# Patient Record
Sex: Female | Born: 1935 | Race: White | Hispanic: No | Smoking: Never smoker
Health system: Southern US, Community
[De-identification: ages and names within clinical notes are randomized; demographics above are authoritative.]

## PROBLEM LIST (undated history)

## (undated) DIAGNOSIS — H9190 Unspecified hearing loss, unspecified ear: Secondary | ICD-10-CM

## (undated) DIAGNOSIS — I10 Essential (primary) hypertension: Secondary | ICD-10-CM

## (undated) DIAGNOSIS — H3530 Unspecified macular degeneration: Secondary | ICD-10-CM

## (undated) DIAGNOSIS — N809 Endometriosis, unspecified: Secondary | ICD-10-CM

## (undated) DIAGNOSIS — M4800 Spinal stenosis, site unspecified: Secondary | ICD-10-CM

## (undated) DIAGNOSIS — E7800 Pure hypercholesterolemia, unspecified: Secondary | ICD-10-CM

## (undated) DIAGNOSIS — E039 Hypothyroidism, unspecified: Secondary | ICD-10-CM

## (undated) DIAGNOSIS — K5792 Diverticulitis of intestine, part unspecified, without perforation or abscess without bleeding: Secondary | ICD-10-CM

## (undated) DIAGNOSIS — M1990 Unspecified osteoarthritis, unspecified site: Secondary | ICD-10-CM

## (undated) DIAGNOSIS — K219 Gastro-esophageal reflux disease without esophagitis: Secondary | ICD-10-CM

## (undated) DIAGNOSIS — R7303 Prediabetes: Secondary | ICD-10-CM

## (undated) DIAGNOSIS — E785 Hyperlipidemia, unspecified: Secondary | ICD-10-CM

## (undated) DIAGNOSIS — R011 Cardiac murmur, unspecified: Secondary | ICD-10-CM

## (undated) HISTORY — DX: Cardiac murmur, unspecified: R01.1

## (undated) HISTORY — DX: Hypothyroidism, unspecified: E03.9

## (undated) HISTORY — DX: Endometriosis, unspecified: N80.9

## (undated) HISTORY — PX: ABDOMINAL HYSTERECTOMY: SHX81

## (undated) HISTORY — DX: Spinal stenosis, site unspecified: M48.00

## (undated) HISTORY — DX: Gastro-esophageal reflux disease without esophagitis: K21.9

## (undated) HISTORY — PX: TONSILLECTOMY: SUR1361

## (undated) HISTORY — PX: TUMOR REMOVAL: SHX12

## (undated) HISTORY — DX: Hyperlipidemia, unspecified: E78.5

## (undated) HISTORY — DX: Prediabetes: R73.03

## (undated) HISTORY — DX: Unspecified macular degeneration: H35.30

## (undated) HISTORY — DX: Unspecified osteoarthritis, unspecified site: M19.90

## (undated) HISTORY — PX: KNEE SURGERY: SHX244

## (undated) HISTORY — DX: Pure hypercholesterolemia, unspecified: E78.00

## (undated) HISTORY — DX: Unspecified hearing loss, unspecified ear: H91.90

## (undated) HISTORY — PX: BACK SURGERY: SHX140

## (undated) HISTORY — PX: ANKLE SURGERY: SHX546

## (undated) HISTORY — PX: APPENDECTOMY: SHX54

---

## 1997-11-25 ENCOUNTER — Ambulatory Visit (HOSPITAL_COMMUNITY): Admission: RE | Admit: 1997-11-25 | Discharge: 1997-11-25 | Payer: Self-pay | Admitting: Internal Medicine

## 1998-02-10 ENCOUNTER — Other Ambulatory Visit: Admission: RE | Admit: 1998-02-10 | Discharge: 1998-02-10 | Payer: Self-pay | Admitting: Internal Medicine

## 1998-12-17 ENCOUNTER — Encounter: Payer: Self-pay | Admitting: Internal Medicine

## 1998-12-17 ENCOUNTER — Ambulatory Visit (HOSPITAL_COMMUNITY): Admission: RE | Admit: 1998-12-17 | Discharge: 1998-12-17 | Payer: Self-pay | Admitting: Internal Medicine

## 2000-12-20 ENCOUNTER — Encounter: Payer: Self-pay | Admitting: Internal Medicine

## 2000-12-20 ENCOUNTER — Ambulatory Visit (HOSPITAL_COMMUNITY): Admission: RE | Admit: 2000-12-20 | Discharge: 2000-12-20 | Payer: Self-pay | Admitting: Internal Medicine

## 2001-04-12 ENCOUNTER — Ambulatory Visit (HOSPITAL_COMMUNITY): Admission: RE | Admit: 2001-04-12 | Discharge: 2001-04-12 | Payer: Self-pay | Admitting: Ophthalmology

## 2004-02-22 ENCOUNTER — Ambulatory Visit (HOSPITAL_COMMUNITY): Admission: RE | Admit: 2004-02-22 | Discharge: 2004-02-22 | Payer: Self-pay | Admitting: Internal Medicine

## 2005-06-13 ENCOUNTER — Ambulatory Visit: Payer: Self-pay

## 2007-09-24 ENCOUNTER — Ambulatory Visit (HOSPITAL_COMMUNITY): Admission: RE | Admit: 2007-09-24 | Discharge: 2007-09-24 | Payer: Self-pay | Admitting: Internal Medicine

## 2007-12-02 ENCOUNTER — Ambulatory Visit (HOSPITAL_COMMUNITY): Admission: RE | Admit: 2007-12-02 | Discharge: 2007-12-03 | Payer: Self-pay | Admitting: Orthopaedic Surgery

## 2009-01-26 IMAGING — CR DG LUMBAR SPINE 1V
1 series · 1 of 1 positions shown · non-contrast
Comparison: none

CLINICAL DATA: Lumbar spine stenosis. 
 LUMBAR SPINE - 1 VIEW:

[view not recorded]
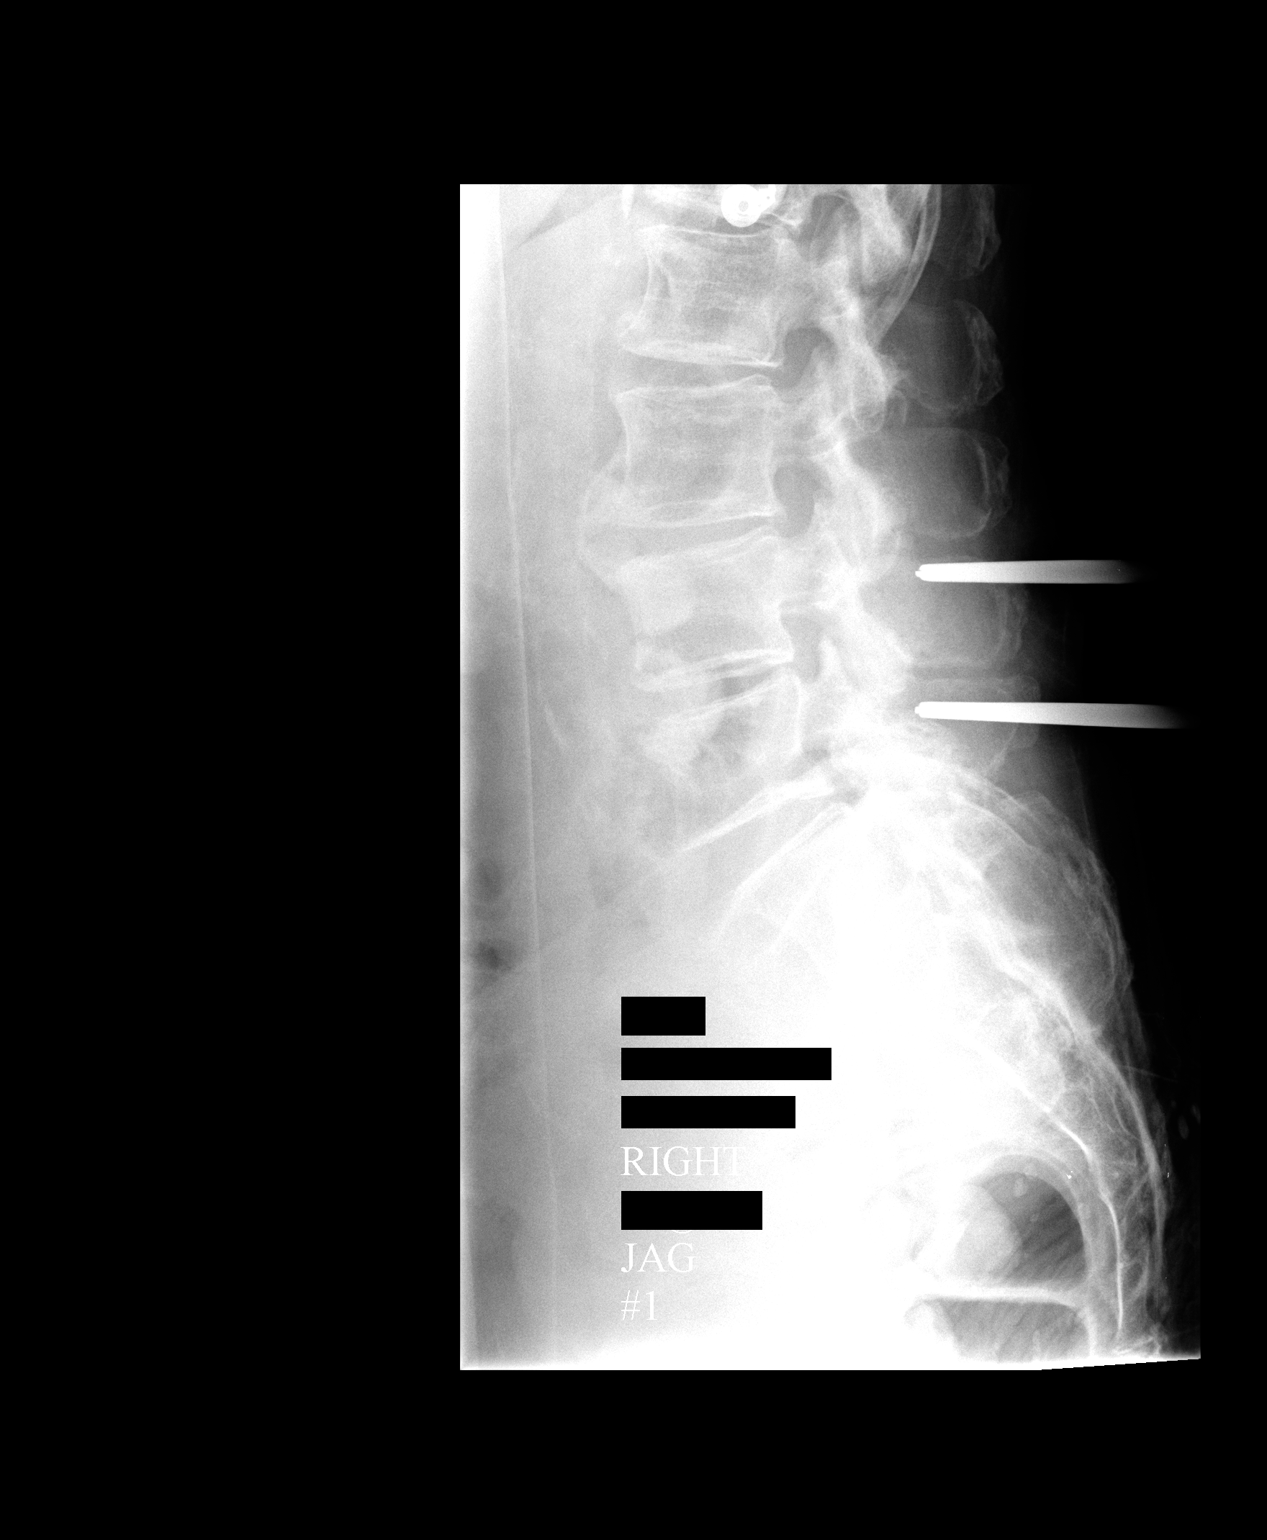

[1 of 1 positions shown; findings below may reference images not displayed]

FINDINGS: A single intraoperative view demonstrates surgical placement markers posterior to the L4 and L5 vertebral bodies.
IMPRESSION: Surgical markers as described above.

## 2009-02-25 ENCOUNTER — Ambulatory Visit (HOSPITAL_COMMUNITY): Admission: RE | Admit: 2009-02-25 | Discharge: 2009-02-25 | Payer: Self-pay | Admitting: Internal Medicine

## 2009-03-23 ENCOUNTER — Emergency Department (HOSPITAL_BASED_OUTPATIENT_CLINIC_OR_DEPARTMENT_OTHER): Admission: EM | Admit: 2009-03-23 | Discharge: 2009-03-23 | Payer: Self-pay | Admitting: Emergency Medicine

## 2009-09-19 ENCOUNTER — Emergency Department (HOSPITAL_BASED_OUTPATIENT_CLINIC_OR_DEPARTMENT_OTHER): Admission: EM | Admit: 2009-09-19 | Discharge: 2009-09-19 | Payer: Self-pay | Admitting: Emergency Medicine

## 2010-03-23 ENCOUNTER — Ambulatory Visit: Payer: Self-pay | Admitting: Vascular Surgery

## 2010-10-02 ENCOUNTER — Encounter: Payer: Self-pay | Admitting: Internal Medicine

## 2010-10-03 ENCOUNTER — Encounter: Payer: Self-pay | Admitting: Internal Medicine

## 2010-11-27 LAB — BASIC METABOLIC PANEL
BUN: 30 mg/dL — ABNORMAL HIGH (ref 6–23)
Chloride: 103 mEq/L (ref 96–112)
GFR calc Af Amer: >60 mL/min (ref >60)
GFR calc non Af Amer: 60 mL/min — ABNORMAL LOW (ref >60)
Potassium: 4.4 mEq/L (ref 3.5–5.1)
Sodium: 141 mEq/L (ref 135–145)

## 2010-11-27 LAB — POCT CARDIAC MARKERS
Myoglobin, poc: 86.3 ng/mL (ref 12–200)
Troponin i, poc: <0.05 ng/mL (ref 0.00–0.09)

## 2010-11-27 LAB — URINALYSIS, ROUTINE W REFLEX MICROSCOPIC
Glucose, UA: NEGATIVE mg/dL
Hgb urine dipstick: NEGATIVE
Ketones, ur: NEGATIVE mg/dL
pH: 7.5 (ref 5.0–8.0)

## 2010-11-27 LAB — CBC
HCT: 40.4 % (ref 36.0–46.0)
Hemoglobin: 13.7 g/dL (ref 12.0–15.0)
MCV: 92.4 fL (ref 78.0–100.0)
Platelets: 237 10*3/uL (ref 150–400)
RBC: 4.38 MIL/uL (ref 3.87–5.11)
WBC: 11.3 10*3/uL — ABNORMAL HIGH (ref 4.0–10.5)

## 2010-11-27 LAB — DIFFERENTIAL
Eosinophils Relative: 0 % (ref 0–5)
Lymphocytes Relative: 21 % (ref 12–46)
Lymphs Abs: 2.3 10*3/uL (ref 0.7–4.0)
Monocytes Relative: 4 % (ref 3–12)

## 2011-01-24 NOTE — Procedures (Signed)
DUPLEX DEEP VENOUS EXAM - LOWER EXTREMITY   INDICATION:  Followup, left leg swelling.   HISTORY:  Edema:  Yes.  Trauma/Surgery:  No.  Pain:  No.  PE:  No.  Previous DVT:  No.  Anticoagulants:  No.  Other:  No.   DUPLEX EXAM:                CFV   SFV   PopV  PTV    GSV                R  L  R  L  R  L  R   L  R  L  Thrombosis    o  o     o     o      o     o  Spontaneous   +  +     +     +      +     +  Phasic        +  +     +     +      +     +  Augmentation  +  +     +     +      +     +  Compressible  +  +     +     +      +     +  Competent     +  o     +     +      +     o   Legend:  + - yes  o - no  p - partial  D - decreased   IMPRESSION:  All veins imaged appear compressible and free from deep  venous thrombus.  Reflux is noted in the left common femoral vein and  greater saphenous vein.   Preliminary report given to Lurena Joiner at office.    _____________________________  Di Kindle. Edilia Bo, M.D.   CB/MEDQ  D:  03/23/2010  T:  03/23/2010  Job:  [id number]

## 2011-01-24 NOTE — Op Note (Signed)
[name], Kristie [name]           ACCOUNT NO.:  [id number]   MEDICAL RECORD NO.:  [id number]           PATIENT TYPE:   LOCATION:                                 FACILITY:   PHYSICIAN:  Mark C. Ophelia Charter, M.D.    DATE OF BIRTH:  1923/07/02   DATE OF PROCEDURE:  11/28/2007  DATE OF DISCHARGE:                               OPERATIVE REPORT   PREOPERATIVE DIAGNOSIS:  L4-5, L5-S1 central stenosis with neurogenic  claudication.   POSTOPERATIVE DIAGNOSIS:  L4-5, L5-S1 central stenosis with neurogenic  claudication.   PROCEDURE:  L4-5, L5-S1 decompression, microscope assisted.   SURGEON:  Mark C. Ophelia Charter, M.D.   ASSISTANT:  Kristie Neighbors, PA-C.   ANESTHESIA:  GOT.   ESTIMATED BLOOD LOSS:  Minimal.   This 77 year old female has had progressive neurogenic claudication with  pain with standing, pain with ambulation short distances, and MRI scan  shows severe spinal stenosis at L4-5, moderately severe at L5-S1.  Other  levels are relatively normal for her age.   PROCEDURE:  After induction of general anesthesia, orotracheal  intubation, the patient was placed prone on chest rolls.  Back was  prepped with DuraPrep.  The area was squared with towels.  Then, a Vi-  Drape applied, then laminectomy sheets and drapes.  Time-out checklist  was performed and confirmed.  Preoperative Ancef was given for ET  prophylaxis, and Solu-Cortef due to her past prednisone history x  several years.  Midline incision was made.  Subperiosteal dissection,  with L5-S1 palpated and L4-5.  Kocher clamp was placed just above the L4-  5 level, expected at the L4 off pedicle, the top of the area where the  decompression needed to be performed, and then one distal just above the  L5-S1 disc space.  Cross-table lateral x-ray confirmed appropriate  position and marking on the bone with the skin marker as well as the  skin and the drapes for the planned decompression.  The L4-5 level was  done first, which was  extremely tight.  After this was performed, the  lamina was thinned down at L5.  Thick chunks of ligament were removed at  the L5-S1 level and foraminotomies performed.  There was significant  facet overhang at L4-5, which was trimmed back to the level of the  pedicle.  There were hypertrophic chunks of ligament that were causing  the central stenosis, and this was removed.  There was mild scar tissue  between the ligament and the dura which was dissected with the operative  microscope, removing the chunks of ligament after passing a hockey stick  underneath the dural separator to freed it up and removing it with the 2  and 3 mm Kerrisons.  Once all ligaments were removed, some remaining  bone spurs were trimmed, and then both gutters were run, both right and  left side.  There was significant disc protrusion at the L4-5 level, and  although  decompression had been done, there were some extruded  fragments, so an incision was made in the annulus, and a ball-tip nerve  hook was used to press against the ligament  at the midline and tease it  toward the opening, and the small chunks of disc that were extruded and  causing the central decompression were sucked up with the sucker and  grasped with the micropituitary.  Passes were not made centrally through  the disc.  Once the fragments were decompressed and extruded wound, the  wound was irrigated, final passing anteriorly, with no areas  compression.  Hemostasis was obtained with bipolar and the  regular cautery.  Deep fascia was closed with 0 Vicryl, 2-0 Vicryl in  the subcutaneous tissue, 4-0 Vicryl subcuticular closure.  Tincture of  Benzoin, Steri-Strips, and Marcaine infiltration.  Postop dressing, and  transferred to the recovery room in stable condition.  Instrument count  and needle count was correct.      Mark C. Ophelia Charter, M.D.  Electronically Signed     MCY/MEDQ  D:  12/02/2007  T:  12/03/2007  Job:  [id number]

## 2011-01-27 NOTE — H&P (Signed)
[place]. [facility]  Patient:    Kristie [name], Kristie [name]                  MRN: [id number] Adm. Date:  [id number] Disc. Date: [id number] Attending:  Ivor Messier CC:         Delon Sacramento, M.D.  Marinus Maw, M.D.   History and Physical  REASON FOR ADMISSION:  This was a planned outpatient surgical admission of this 77 year old white female admitted for vitrectomy surgery of the right eye.  PRESENT ILLNESS:  This patient was referred to my office by Dr. Delon Sacramento, her regular ophthalmologist, for evaluation of retained lens fragments following cataract and posterior capsule surgery.  The patient had cataract surgery performed on April 10, 2000.  Postoperatively, the patient had developed a thick posterior capsule and had multiple YAG laser capsulotomies, the original starting on November 2001, followed by August 14, 2001, September 18, 2000, November 06, 2000.  Examination in my office revealed retained cataract capsule fragments in the vitreous and marked vitreous fibrillar degeneration behind the implant.  This was particularly troublesome to the patient and it was elected to perform posterior vitrectomy surgery to remove these retained fragments and vitreous degeneration.  Patient has limited vision in her left eye due to childhood amblyopia.  The patient was given oral discussion and printed information concerning the procedure and its possible complications.  She signed an informed consent and arrangements were made for her outpatient admission at this time.  PAST MEDICAL HISTORY:  Patient is in stable general health under the care of her regular physician, Dr. Marinus Maw.  Patient is currently taking Paxil for fibromyalgia, Xanax at night for sleep, prednisone for inflammation of muscles every other day and Synthroid under Dr. Michaelle Birks direction.  REVIEW OF SYSTEMS:  No cardiorespiratory complaints.  PHYSICAL  EXAMINATION:  GENERAL:  Patient is a pleasant, well-nourished, well-developed white female in no acute ocular distress.  VITAL SIGNS:  As recorded on admission, blood pressure 148/107, temperature 97.2, heart rate 62, respirations 16.  HEENT:  Eyes:  Visual acuity 20/50-, right eye; 20/400, left eye, without correction.  Near vision 20/40, right eye; ______ , left eye.  Patient currently only uses magnifier glasses for reading.  Applanation tonometry 14 mm, right eye; 12, left eye.  External ocular and slitlamp examination: The eyes are white and clear.  The right eye reveals a clear cornea, deep and clear anterior chamber.  A posterior chamber well-centered implant is present. The posterior capsule is open with multiple laser pits following the multiple YAG laser capsulotomies.  Directly behind the pupil is a dense area of retained capsule fragments and vitreous fibrillar degeneration.  The vitreous posteriorly is clear, the retina attached with optic nerve sharply outlined and of good color.  Disk-cup ratio 0.2.  The macula is slightly glassy in appearance without hemorrhage, exudation of neovascularization.  CHEST:  Lungs clear to percussion and auscultation.  HEART:  Normal sinus rhythm.  No cardiomegaly.  No murmurs.  ABDOMEN:  Negative.  EXTREMITIES:  Negative.  ADMISSION DIAGNOSIS:  Retained cataract fragments following cataract and posterior capsule surgery.  SURGICAL PLAN:  Posterior vitrectomy through pars plana through vitreous infusion suction cutter. DD:  04/12/01 TD:  04/12/01 Job: [id number]

## 2011-01-27 NOTE — Op Note (Signed)
[place]. [facility]  Patient:    Kristie [name], Kristie [name]                  MRN: [id number] Proc. Date: 04/12/01 Adm. Date:  [id number] Disc. Date: [id number] Attending:  Ivor Messier CC:         Delon Sacramento, M.D.  Marinus Maw, M.D.   Operative Report  PREOPERATIVE DIAGNOSIS:  Retained cataract fragments following cataract and posterior capsule surgery.  POSTOPERATIVE DIAGNOSIS:  Retained cataract fragments following cataract and posterior capsule surgery.  OPERATION:  Posterior vitrectomy through pars plana using vitreous infusion suction cutter.  SURGEON:  Guadelupe Sabin, M.D.  ASSISTANT:  Nurse.  ANESTHESIA:  Local 4% Xylocaine, 0.75% Marcaine retrobulbar block, topical tetracaine.  Anesthesia standby required.  Patient given sodium pentothal intravenously during the period of retrobulbar injection.  DESCRIPTION OF PROCEDURE:  A lid speculum was inserted in the right eye.  A superior rectus traction suture was placed.  A peritomy was performed adjacent to the limbus from the 8 to 2:30 position.  The corneoscleral junction was cleaned and corneoscleral groove made with a 45 degree Superblade.  The anterior chamber was then entered at the 10, 2 and 8 oclock positions with the MVR blade.  The 4-mm vitreous infusion terminal was secured in place at the 8 oclock position with a 5-0 white mattress Dacron suture.  The fiberoptic lightpipe was inserted at the 2 oclock position and the handpiece of the vitreous infusion suction cutter at the 10 oclock position.  Slow vitreous infusion suction cutting were begun.  The dense vitreous fibrillar degeneration and retained capsule fragments were then aspirated.  Gradually, the vitreous was cleared as far as possible into the periphery.  The posterior vitreous was aspirated and appeared clear.  The retina appeared attached with normal optic nerve, blood vessel and macula.  It was then  elected to close after indirect ophthalmoscopy with scleral depression was carried out revealing no peripheral retinal tears or detachment areas.  The sclerotomy sites were closed with 7-0 Vicryl interrupted sutures.  The conjunctiva itself was closed with a running 7-0 Vicryl suture.  Depo-dexamethasone and Garamycin were injected in the sub-tenon space inferiorly.  Atropine and Maxitrol ointment were instilled in the conjunctival cul-de-sac.  A light patch and protective shield were applied.  Duration of procedure:  One hour.  Patient tolerated procedure well in general and left the operating room for the recovery room in good condition. DD:  04/12/01 TD:  04/12/01 Job: [id number]

## 2011-02-13 ENCOUNTER — Emergency Department (INDEPENDENT_AMBULATORY_CARE_PROVIDER_SITE_OTHER): Payer: PRIVATE HEALTH INSURANCE

## 2011-02-13 ENCOUNTER — Emergency Department (HOSPITAL_BASED_OUTPATIENT_CLINIC_OR_DEPARTMENT_OTHER)
Admission: EM | Admit: 2011-02-13 | Discharge: 2011-02-13 | Disposition: A | Discharge status: Home / Self Care | Payer: PRIVATE HEALTH INSURANCE | Attending: Emergency Medicine | Admitting: Emergency Medicine

## 2011-02-13 DIAGNOSIS — R5383 Other fatigue: Secondary | ICD-10-CM

## 2011-02-13 DIAGNOSIS — IMO0001 Reserved for inherently not codable concepts without codable children: Secondary | ICD-10-CM | POA: Insufficient documentation

## 2011-02-13 DIAGNOSIS — R5381 Other malaise: Secondary | ICD-10-CM | POA: Insufficient documentation

## 2011-02-13 DIAGNOSIS — E7800 Pure hypercholesterolemia, unspecified: Secondary | ICD-10-CM | POA: Insufficient documentation

## 2011-02-13 DIAGNOSIS — J438 Other emphysema: Secondary | ICD-10-CM | POA: Insufficient documentation

## 2011-02-13 DIAGNOSIS — I10 Essential (primary) hypertension: Secondary | ICD-10-CM | POA: Insufficient documentation

## 2011-02-13 LAB — COMPREHENSIVE METABOLIC PANEL
ALT: 22 U/L (ref 0–35)
AST: 28 U/L (ref 0–37)
Albumin: 4 g/dL (ref 3.5–5.2)
Alkaline Phosphatase: 74 U/L (ref 39–117)
BUN: 15 mg/dL (ref 6–23)
Potassium: 3.7 mEq/L (ref 3.5–5.1)
Sodium: 140 mEq/L (ref 135–145)
Total Protein: 7.4 g/dL (ref 6.0–8.3)

## 2011-02-13 LAB — URINALYSIS, ROUTINE W REFLEX MICROSCOPIC
Bilirubin Urine: NEGATIVE
Ketones, ur: NEGATIVE mg/dL
Nitrite: NEGATIVE
Urobilinogen, UA: 0.2 mg/dL (ref 0.0–1.0)

## 2011-02-13 LAB — CBC
MCHC: 33.1 g/dL (ref 30.0–36.0)
Platelets: 224 10*3/uL (ref 150–400)
RDW: 13.9 % (ref 11.5–15.5)

## 2011-02-13 LAB — DIFFERENTIAL
Basophils Absolute: 0 10*3/uL (ref 0.0–0.1)
Basophils Relative: 0 % (ref 0–1)
Eosinophils Absolute: 0 10*3/uL (ref 0.0–0.7)
Eosinophils Relative: 0 % (ref 0–5)
Monocytes Absolute: 0.7 10*3/uL (ref 0.1–1.0)

## 2011-06-05 LAB — CBC
HCT: 46
MCV: 94.4
Platelets: 248
WBC: 10.1

## 2011-06-05 LAB — COMPREHENSIVE METABOLIC PANEL
AST: 30
CO2: 29
Calcium: 9.8
Creatinine, Ser: 0.87
GFR calc non Af Amer: >60

## 2011-06-05 LAB — URINALYSIS, ROUTINE W REFLEX MICROSCOPIC
Ketones, ur: NEGATIVE
Nitrite: NEGATIVE
Protein, ur: NEGATIVE

## 2011-06-05 LAB — DIFFERENTIAL
Basophils Absolute: 0.1
Lymphocytes Relative: 37
Monocytes Absolute: 0.5
Neutro Abs: 5.8

## 2011-06-05 LAB — PROTIME-INR
INR: 0.9
Prothrombin Time: 12.5

## 2011-06-05 LAB — APTT: aPTT: 32

## 2012-01-11 ENCOUNTER — Emergency Department (INDEPENDENT_AMBULATORY_CARE_PROVIDER_SITE_OTHER): Payer: Medicare Other

## 2012-01-11 ENCOUNTER — Encounter (HOSPITAL_BASED_OUTPATIENT_CLINIC_OR_DEPARTMENT_OTHER): Payer: Self-pay | Admitting: *Deleted

## 2012-01-11 ENCOUNTER — Emergency Department (HOSPITAL_BASED_OUTPATIENT_CLINIC_OR_DEPARTMENT_OTHER)
Admission: EM | Admit: 2012-01-11 | Discharge: 2012-01-11 | Disposition: A | Discharge status: Home / Self Care | Payer: Medicare Other | Attending: Emergency Medicine | Admitting: Emergency Medicine

## 2012-01-11 DIAGNOSIS — K5730 Diverticulosis of large intestine without perforation or abscess without bleeding: Secondary | ICD-10-CM

## 2012-01-11 DIAGNOSIS — R109 Unspecified abdominal pain: Secondary | ICD-10-CM | POA: Insufficient documentation

## 2012-01-11 DIAGNOSIS — K5732 Diverticulitis of large intestine without perforation or abscess without bleeding: Secondary | ICD-10-CM | POA: Insufficient documentation

## 2012-01-11 DIAGNOSIS — R911 Solitary pulmonary nodule: Secondary | ICD-10-CM

## 2012-01-11 DIAGNOSIS — R011 Cardiac murmur, unspecified: Secondary | ICD-10-CM | POA: Insufficient documentation

## 2012-01-11 DIAGNOSIS — E079 Disorder of thyroid, unspecified: Secondary | ICD-10-CM | POA: Insufficient documentation

## 2012-01-11 DIAGNOSIS — R1032 Left lower quadrant pain: Secondary | ICD-10-CM

## 2012-01-11 DIAGNOSIS — K5792 Diverticulitis of intestine, part unspecified, without perforation or abscess without bleeding: Secondary | ICD-10-CM

## 2012-01-11 DIAGNOSIS — R198 Other specified symptoms and signs involving the digestive system and abdomen: Secondary | ICD-10-CM

## 2012-01-11 LAB — CBC
HCT: 43.7 % (ref 36.0–46.0)
Platelets: 191 10*3/uL (ref 150–400)
RBC: 4.74 MIL/uL (ref 3.87–5.11)
RDW: 14.3 % (ref 11.5–15.5)
WBC: 7.1 10*3/uL (ref 4.0–10.5)

## 2012-01-11 LAB — COMPREHENSIVE METABOLIC PANEL
Alkaline Phosphatase: 67 U/L (ref 39–117)
BUN: 16 mg/dL (ref 6–23)
Creatinine, Ser: 0.9 mg/dL (ref 0.50–1.10)
GFR calc Af Amer: 65 mL/min — ABNORMAL LOW (ref >90)
Glucose, Bld: 83 mg/dL (ref 70–99)
Potassium: 3.7 mEq/L (ref 3.5–5.1)
Total Bilirubin: 0.7 mg/dL (ref 0.3–1.2)
Total Protein: 6.9 g/dL (ref 6.0–8.3)

## 2012-01-11 LAB — URINALYSIS, ROUTINE W REFLEX MICROSCOPIC
Leukocytes, UA: NEGATIVE
Nitrite: NEGATIVE
Specific Gravity, Urine: 1.007 (ref 1.005–1.030)
Urobilinogen, UA: 0.2 mg/dL (ref 0.0–1.0)

## 2012-01-11 LAB — DIFFERENTIAL
Basophils Absolute: 0 10*3/uL (ref 0.0–0.1)
Lymphocytes Relative: 26 % (ref 12–46)
Neutro Abs: 4.7 10*3/uL (ref 1.7–7.7)

## 2012-01-11 LAB — LIPASE, BLOOD: Lipase: 42 U/L (ref 11–59)

## 2012-01-11 MED ORDER — ONDANSETRON HCL 4 MG/2ML IJ SOLN
4.0000 mg | Freq: Once | INTRAMUSCULAR | Status: AC
  Administered 2012-01-11: 4 mg via INTRAVENOUS
  Filled 2012-01-11: qty 2

## 2012-01-11 MED ORDER — HYDROMORPHONE HCL PF 1 MG/ML IJ SOLN
0.5000 mg | Freq: Once | INTRAMUSCULAR | Status: AC
  Administered 2012-01-11: 0.5 mg via INTRAVENOUS
  Filled 2012-01-11: qty 1

## 2012-01-11 MED ORDER — IOHEXOL 300 MG/ML  SOLN
100.0000 mL | Freq: Once | INTRAMUSCULAR | Status: AC | PRN
  Administered 2012-01-11: 100 mL via INTRAVENOUS

## 2012-01-11 MED ORDER — METRONIDAZOLE 500 MG PO TABS
500.0000 mg | ORAL_TABLET | Freq: Once | ORAL | Status: AC
  Administered 2012-01-11: 500 mg via ORAL
  Filled 2012-01-11: qty 1

## 2012-01-11 MED ORDER — SODIUM CHLORIDE 0.9 % IV BOLUS (SEPSIS)
1000.0000 mL | Freq: Once | INTRAVENOUS | Status: AC
  Administered 2012-01-11: 1000 mL via INTRAVENOUS

## 2012-01-11 MED ORDER — IOHEXOL 300 MG/ML  SOLN
36.0000 mL | Freq: Once | INTRAMUSCULAR | Status: AC | PRN
  Administered 2012-01-11: 36 mL via INTRAVENOUS

## 2012-01-11 MED ORDER — CIPROFLOXACIN HCL 500 MG PO TABS: 500.0000 mg | ORAL_TABLET | Freq: Two times a day (BID) | ORAL | Status: AC

## 2012-01-11 MED ORDER — CIPROFLOXACIN HCL 500 MG PO TABS
500.0000 mg | ORAL_TABLET | Freq: Once | ORAL | Status: AC
  Administered 2012-01-11: 500 mg via ORAL
  Filled 2012-01-11: qty 1

## 2012-01-11 MED ORDER — METRONIDAZOLE 500 MG PO TABS: 500.0000 mg | ORAL_TABLET | Freq: Two times a day (BID) | ORAL | Status: AC

## 2012-01-11 NOTE — ED Notes (Signed)
Pt states that she does not take laxatives at home, "they just don't agree with me..." and has not used suppository or enemas, states "I just come here to the hospital..."

## 2012-01-11 NOTE — ED Notes (Signed)
Pt amb to room 5 with quick steady gait in nad. Pt reports her usual constipation symptoms of bloating, pressure, and pain to her llq. Pt also reports pain to her left hip, denies any injury or trauma. Last bm was Tuesday per pt. Denies any n/v/d or fevers, pt states she has seen her pcp for this problem several times, "he gave me something for indigestion, but it didn't help.Marland KitchenMarland Kitchen"

## 2012-01-11 NOTE — ED Notes (Signed)
Patient is resting comfortably. 

## 2012-01-11 NOTE — ED Notes (Signed)
Pt states she began feeling shaky after drinking contrast dye. States the shaking is gone now. Will continue to assess

## 2012-01-11 NOTE — ED Notes (Signed)
MD at bedside. 

## 2012-01-11 NOTE — ED Provider Notes (Signed)
History     CSN: [id number]  Arrival date & time 01/11/12  4098   First MD Initiated Contact with Patient 01/11/12 [phone]      Chief Complaint  Patient presents with  . Abdominal Pain  . Bloated  . Constipation     HPI The patient presents with concerns over abdominal pain.  She notes that over the past weeks she has had vague, diffuse, crampy abdominal shortness with accompanying mild nausea and anorexia.  Over the past days the pain has focally become more present and sharp in the left lower quadrant with radiation towards her hip.  She denies any new distal dysesthesia in the left lower extremity, or any new weakness/bowel or bladder changes.  She does note that her nausea continues.  She also has chronic constipation as well as chronic neck pain for which she takes tramadol.  Her current complaints were not improved with OTC medications, and there are no clear exacerbating factors. Past Medical History  Diagnosis Date  . Thyroid disease     History reviewed. No pertinent past surgical history.  History reviewed. No pertinent family history.  History  Substance Use Topics  . Smoking status: Never Smoker   . Smokeless tobacco: Not on file  . Alcohol Use:     OB History    Grav Para Term Preterm Abortions TAB SAB Ect Mult Living                  Review of Systems  Constitutional:       HPI  HENT:       HPI otherwise negative  Eyes: Negative.   Respiratory:       HPI, otherwise negative  Cardiovascular:       HPI, otherwise nmegative  Gastrointestinal: Negative for vomiting.  Genitourinary:       HPI, otherwise negative  Musculoskeletal:       HPI, otherwise negative  Skin: Negative.   Neurological: Negative for syncope.    Allergies  Review of patient's allergies indicates no known allergies.  Home Medications   Current Outpatient Rx  Name Route Sig Dispense Refill  . ALPRAZOLAM 1 MG PO TABS Oral Take 1 mg by mouth at bedtime as needed.    Marland Kitchen  LEVOTHYROXINE SODIUM 75 MCG PO TABS Oral Take 75 mcg by mouth daily.      BP 192/68  Pulse 73  Temp(Src) 98.5 F (36.9 C) (Oral)  Resp 16  Ht 5\' 1"  (1.549 m)  Wt 120 lb (54.432 kg)  BMI 22.67 kg/m2  SpO2 99%  Physical Exam  Nursing note and vitals reviewed. Constitutional: She is oriented to person, place, and time. She appears well-developed and well-nourished. No distress.  HENT:  Head: Normocephalic and atraumatic.  Eyes: Conjunctivae and EOM are normal.  Cardiovascular: Normal rate and regular rhythm.   Murmur heard. Pulmonary/Chest: Effort normal and breath sounds normal. No stridor. No respiratory distress.  Abdominal: Soft. Normal appearance and bowel sounds are normal. She exhibits no distension. There is no hepatosplenomegaly. There is tenderness in the epigastric area, periumbilical area, suprapubic area, left upper quadrant and left lower quadrant. There is guarding. There is no rigidity, no rebound and no CVA tenderness.  Musculoskeletal: She exhibits no edema.  Neurological: She is alert and oriented to person, place, and time. No cranial nerve deficit.  Skin: Skin is warm and dry.  Psychiatric: She has a normal mood and affect.    ED Course  Procedures (including critical care time)  Labs Reviewed  COMPREHENSIVE METABOLIC PANEL  CBC  DIFFERENTIAL  LIPASE, BLOOD  URINALYSIS, ROUTINE W REFLEX MICROSCOPIC   No results found.   No diagnosis found.  Cardiac monitor 75 sinus rhythm normal Pulse oximetry 100% room air normal   MDM  This elderly female presents with new diffuse abdominal discomfort that has migrated to pain on the left lower quadrant.  On my exam the patient is in no distress, with unremarkable vital signs.  The patient is ambulatory, without an antalgic gait, and has appropriate lower tremor the strength and sensation.  Patient has mild tenderness to palpation on her abdomen, which is suggestive of diverticular disease given her history of  bowel movement changes as well as the pain.  The patient's CT is mildly suggestive of diverticulitis, for which the patient started on antibiotics.  I discussed all results with the patient, including the pulmonary nodules, atherosclerotic plaques, GI findings.  The patient needs to have GI followup for other considerations of her pain, with colonoscopy recommended.  Although the patient notes that the pain radiates to her left hip, there are no notable physical exam findings suggestive of ongoing hip pathology.  The patient was made aware of this, and instructed to monitor the condition of her hip and to return for any concerning changes.  Gerhard Munch, MD 01/11/12 1224

## 2012-01-11 NOTE — Discharge Instructions (Signed)
As we discussed, there are multiple findings on her CAT scan.  Notably, the study demonstrates changes suggestive of diverticulitis.  For this you have been prescribed antibiotics.  Please make sure to follow up with both your physician and the gastroenterologist.  It is very important that he see these physicians, and discuss both how you are progressing, and to schedule a colonoscopy.  Return to the emergency department for any concerning changes in your condition.
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
## 2012-03-04 ENCOUNTER — Other Ambulatory Visit: Payer: Self-pay | Admitting: Internal Medicine

## 2012-03-04 DIAGNOSIS — R918 Other nonspecific abnormal finding of lung field: Secondary | ICD-10-CM

## 2012-03-07 ENCOUNTER — Ambulatory Visit
Admission: RE | Admit: 2012-03-07 | Discharge: 2012-03-07 | Disposition: A | Discharge status: Home / Self Care | Payer: Medicare Other | Source: Ambulatory Visit | Attending: Internal Medicine | Admitting: Internal Medicine

## 2012-03-07 DIAGNOSIS — R918 Other nonspecific abnormal finding of lung field: Secondary | ICD-10-CM

## 2012-08-14 ENCOUNTER — Other Ambulatory Visit: Payer: Self-pay | Admitting: Internal Medicine

## 2012-08-14 ENCOUNTER — Emergency Department (HOSPITAL_BASED_OUTPATIENT_CLINIC_OR_DEPARTMENT_OTHER): Payer: Medicare Other

## 2012-08-14 ENCOUNTER — Emergency Department (HOSPITAL_BASED_OUTPATIENT_CLINIC_OR_DEPARTMENT_OTHER)
Admission: EM | Admit: 2012-08-14 | Discharge: 2012-08-15 | Disposition: A | Discharge status: Home / Self Care | Payer: Medicare Other | Attending: Emergency Medicine | Admitting: Emergency Medicine

## 2012-08-14 ENCOUNTER — Encounter (HOSPITAL_BASED_OUTPATIENT_CLINIC_OR_DEPARTMENT_OTHER): Payer: Self-pay | Admitting: *Deleted

## 2012-08-14 DIAGNOSIS — K5900 Constipation, unspecified: Secondary | ICD-10-CM | POA: Insufficient documentation

## 2012-08-14 DIAGNOSIS — E079 Disorder of thyroid, unspecified: Secondary | ICD-10-CM | POA: Insufficient documentation

## 2012-08-14 DIAGNOSIS — R3915 Urgency of urination: Secondary | ICD-10-CM | POA: Insufficient documentation

## 2012-08-14 DIAGNOSIS — Z79899 Other long term (current) drug therapy: Secondary | ICD-10-CM | POA: Insufficient documentation

## 2012-08-14 DIAGNOSIS — E876 Hypokalemia: Secondary | ICD-10-CM

## 2012-08-14 DIAGNOSIS — I10 Essential (primary) hypertension: Secondary | ICD-10-CM | POA: Insufficient documentation

## 2012-08-14 DIAGNOSIS — R110 Nausea: Secondary | ICD-10-CM | POA: Insufficient documentation

## 2012-08-14 DIAGNOSIS — R350 Frequency of micturition: Secondary | ICD-10-CM | POA: Insufficient documentation

## 2012-08-14 DIAGNOSIS — Z8719 Personal history of other diseases of the digestive system: Secondary | ICD-10-CM | POA: Insufficient documentation

## 2012-08-14 DIAGNOSIS — R911 Solitary pulmonary nodule: Secondary | ICD-10-CM

## 2012-08-14 DIAGNOSIS — R109 Unspecified abdominal pain: Secondary | ICD-10-CM

## 2012-08-14 DIAGNOSIS — I160 Hypertensive urgency: Secondary | ICD-10-CM

## 2012-08-14 DIAGNOSIS — R1084 Generalized abdominal pain: Secondary | ICD-10-CM | POA: Insufficient documentation

## 2012-08-14 HISTORY — DX: Diverticulitis of intestine, part unspecified, without perforation or abscess without bleeding: K57.92

## 2012-08-14 HISTORY — DX: Essential (primary) hypertension: I10

## 2012-08-14 LAB — URINALYSIS, ROUTINE W REFLEX MICROSCOPIC
Bilirubin Urine: NEGATIVE
Glucose, UA: NEGATIVE mg/dL
Hgb urine dipstick: NEGATIVE
Specific Gravity, Urine: 1.006 (ref 1.005–1.030)
Urobilinogen, UA: 0.2 mg/dL (ref 0.0–1.0)
pH: 7.5 (ref 5.0–8.0)

## 2012-08-14 LAB — COMPREHENSIVE METABOLIC PANEL
ALT: 16 U/L (ref 0–35)
Alkaline Phosphatase: 73 U/L (ref 39–117)
GFR calc Af Amer: 75 mL/min — ABNORMAL LOW (ref >90)
Glucose, Bld: 74 mg/dL (ref 70–99)
Potassium: 3.1 mEq/L — ABNORMAL LOW (ref 3.5–5.1)
Sodium: 141 mEq/L (ref 135–145)
Total Protein: 6.5 g/dL (ref 6.0–8.3)

## 2012-08-14 LAB — CBC WITH DIFFERENTIAL/PLATELET
Eosinophils Absolute: 0.1 10*3/uL (ref 0.0–0.7)
Lymphocytes Relative: 41 % (ref 12–46)
Lymphs Abs: 2.4 10*3/uL (ref 0.7–4.0)
MCH: 30.1 pg (ref 26.0–34.0)
Neutrophils Relative %: 45 % (ref 43–77)
Platelets: 193 10*3/uL (ref 150–400)
RBC: 4.28 MIL/uL (ref 3.87–5.11)
WBC: 5.8 10*3/uL (ref 4.0–10.5)

## 2012-08-14 MED ORDER — ONDANSETRON HCL 4 MG/2ML IJ SOLN
4.0000 mg | Freq: Once | INTRAMUSCULAR | Status: AC
  Administered 2012-08-14: 4 mg via INTRAVENOUS
  Filled 2012-08-14: qty 2

## 2012-08-14 MED ORDER — POTASSIUM CHLORIDE CRYS ER 20 MEQ PO TBCR
40.0000 meq | EXTENDED_RELEASE_TABLET | Freq: Once | ORAL | Status: AC
  Administered 2012-08-14: 40 meq via ORAL
  Filled 2012-08-14: qty 2

## 2012-08-14 MED ORDER — OLMESARTAN MEDOXOMIL 20 MG PO TABS: 20.0000 mg | ORAL_TABLET | Freq: Every day | ORAL | Status: DC

## 2012-08-14 MED ORDER — SODIUM CHLORIDE 0.9 % IV SOLN
1000.0000 mL | Freq: Once | INTRAVENOUS | Status: AC
  Administered 2012-08-14: 1000 mL via INTRAVENOUS

## 2012-08-14 MED ORDER — FENTANYL CITRATE 0.05 MG/ML IJ SOLN: 25.0000 ug | Freq: Once | INTRAMUSCULAR | Status: DC

## 2012-08-14 NOTE — ED Notes (Signed)
Pt c/o abd pain x 1 week recent ct scan done dx constipation

## 2012-08-14 NOTE — ED Notes (Signed)
Patient transported to X-ray 

## 2012-08-14 NOTE — ED Provider Notes (Signed)
History     CSN: [id number]  Arrival date & time 08/14/12  2123   First MD Initiated Contact with Patient 08/14/12 2133      Chief Complaint  Patient presents with  . Abdominal Pain     HPI Patient presents with weakness, abdominal pain.  She notes that the symptoms have developed over the past week.  Since onset symptoms have been progressive, with no clear relieving or exacerbating factors.  She describes diffuse abdominal crampy discomfort that is intermittent.  She notes mild anorexia, mild nausea, but no vomiting.  She endorses mild constipation.  Near the onset of this illness that she was seen at another facility, had a CT scan.  She reports that that scan was notable for demonstration of significant stool burden.  She denies ongoing fevers, chills, chest pain, dyspnea.  She adds that her stomach feels "distended." She notes that today she was also concerned of her BP.  As her Sx were present she checked her BP, and found it to be elevated sbp 213.  She took additional antihypertensives, but did not feel appreciably better.   Past Medical History  Diagnosis Date  . Thyroid disease   . Diverticulitis   . Hypertension     History reviewed. No pertinent past surgical history.  History reviewed. No pertinent family history.  History  Substance Use Topics  . Smoking status: Never Smoker   . Smokeless tobacco: Not on file  . Alcohol Use: No    OB History    Grav Para Term Preterm Abortions TAB SAB Ect Mult Living                  Review of Systems  Constitutional:       Per HPI, otherwise negative  HENT:       Per HPI, otherwise negative  Eyes: Negative.   Respiratory:       Per HPI, otherwise negative  Cardiovascular:       Per HPI, otherwise negative  Gastrointestinal: Positive for nausea and constipation. Negative for vomiting.  Genitourinary: Positive for urgency and frequency.  Musculoskeletal:       Per HPI, otherwise negative  Skin: Negative.    Neurological: Negative for syncope.    Allergies  Review of patient's allergies indicates no known allergies.  Home Medications   Current Outpatient Rx  Name  Route  Sig  Dispense  Refill  . ALPRAZOLAM 1 MG PO TABS   Oral   Take 1 mg by mouth at bedtime as needed.         Marland Kitchen LEVOTHYROXINE SODIUM 75 MCG PO TABS   Oral   Take 75 mcg by mouth daily.           BP 193/73  Pulse 87  Temp 98.2 F (36.8 C) (Oral)  Resp 18  Ht 5' (1.524 m)  Wt 115 lb (52.164 kg)  BMI 22.46 kg/m2  SpO2 100%  Physical Exam  Nursing note and vitals reviewed. Constitutional: She is oriented to person, place, and time. She appears well-developed and well-nourished. No distress.  HENT:  Head: Normocephalic and atraumatic.  Eyes: Conjunctivae normal and EOM are normal.  Cardiovascular: Normal rate and regular rhythm.   Pulmonary/Chest: Effort normal and breath sounds normal. No stridor. No respiratory distress.  Abdominal: Soft. Normal appearance and bowel sounds are normal. She exhibits no distension. There is no tenderness.  Musculoskeletal: She exhibits no edema.  Neurological: She is alert and oriented to person, place, and  time. No cranial nerve deficit.  Skin: Skin is warm and dry.  Psychiatric: She has a normal mood and affect.    ED Course  Procedures (including critical care time)  Labs Reviewed  COMPREHENSIVE METABOLIC PANEL - Abnormal; Notable for the following:    Potassium 3.1 (*)     Albumin 3.3 (*)     GFR calc non Af Amer 64 (*)     GFR calc Af Amer 75 (*)     All other components within normal limits  CBC WITH DIFFERENTIAL  LIPASE, BLOOD  LACTIC ACID, PLASMA  URINALYSIS, ROUTINE W REFLEX MICROSCOPIC   No results found.   No diagnosis found.  11:25 PM The patient states that she feels substantially better. BP 170/70  Cardiac: 85sr, normal  O2- 99%ra, normal  MDM  This elderly female presents with concerns of both hypertension and generalized complaints  with abdominal pain.  On exam she has a soft abdomen, no fever, it is in no clear distress.  The patient's labs demonstrate hypokalemia, but are otherwise largely reassuring.  The patient's blood pressure improves, approximately 20% from her stated high.  The patient feels substantially better.  Absent ongoing complaints, with a largely reassuring evaluation the patient was discharged with instruction to follow up with her primary care physician.  Notably, the patient states that she recently stopped taking her Benicar, in favor of another medication, but seems to not be controlling her blood pressure well.  Due to this she was restarted on her Benicar, pending PMD consult.     Gerhard Munch, MD 08/14/12 2329

## 2012-08-14 NOTE — ED Notes (Signed)
MD at bedside. 

## 2012-08-28 ENCOUNTER — Other Ambulatory Visit: Payer: Medicare Other

## 2013-02-10 ENCOUNTER — Ambulatory Visit: Payer: Medicare Other | Admitting: Cardiovascular Disease

## 2013-02-10 DIAGNOSIS — I10 Essential (primary) hypertension: Secondary | ICD-10-CM | POA: Insufficient documentation

## 2013-02-10 DIAGNOSIS — K5792 Diverticulitis of intestine, part unspecified, without perforation or abscess without bleeding: Secondary | ICD-10-CM | POA: Insufficient documentation

## 2013-02-10 DIAGNOSIS — E039 Hypothyroidism, unspecified: Secondary | ICD-10-CM | POA: Insufficient documentation

## 2013-02-11 ENCOUNTER — Encounter: Payer: Self-pay | Admitting: *Deleted

## 2013-02-11 ENCOUNTER — Encounter: Payer: Self-pay | Admitting: Cardiology

## 2013-02-12 ENCOUNTER — Ambulatory Visit (INDEPENDENT_AMBULATORY_CARE_PROVIDER_SITE_OTHER): Payer: Medicare Other | Admitting: Cardiology

## 2013-02-12 ENCOUNTER — Encounter: Payer: Self-pay | Admitting: Cardiology

## 2013-02-12 VITALS — BP 160/80 | HR 68 | Wt 115.0 lb

## 2013-02-12 DIAGNOSIS — R002 Palpitations: Secondary | ICD-10-CM

## 2013-02-12 DIAGNOSIS — I10 Essential (primary) hypertension: Secondary | ICD-10-CM

## 2013-02-12 DIAGNOSIS — R079 Chest pain, unspecified: Secondary | ICD-10-CM

## 2013-02-12 DIAGNOSIS — R011 Cardiac murmur, unspecified: Secondary | ICD-10-CM

## 2013-02-12 DIAGNOSIS — R9431 Abnormal electrocardiogram [ECG] [EKG]: Secondary | ICD-10-CM

## 2013-02-12 NOTE — Assessment & Plan Note (Signed)
Patient has an aortic stenosis murmur on examination. Echocardiogram to more fully assess.

## 2013-02-12 NOTE — Assessment & Plan Note (Signed)
Plan Myoview.

## 2013-02-12 NOTE — Assessment & Plan Note (Signed)
Check 48 hour Holter monitor. She states she has these symptoms daily.

## 2013-02-12 NOTE — Assessment & Plan Note (Signed)
Continue present medications. 

## 2013-02-12 NOTE — Assessment & Plan Note (Signed)
Symptoms atypical. Schedule adenosine Myoview.

## 2013-02-12 NOTE — Patient Instructions (Addendum)
Your physician recommends that you schedule a follow-up appointment in: 8-10 WEEKS WITH DR Jens Som  Your physician has requested that you have an adenosine myoview. For further information please visit [email or web address] Please follow instruction sheet, as given.   Your physician has requested that you have an echocardiogram. Echocardiography is a painless test that uses sound waves to create images of your heart. It provides your doctor with information about the size and shape of your heart and how well your heart's chambers and valves are working. This procedure takes approximately one hour. There are no restrictions for this procedure.   Your physician has recommended that you wear a 48 HOUR holter monitor. Holter monitors are medical devices that record the heart's electrical activity. Doctors most often use these monitors to diagnose arrhythmias. Arrhythmias are problems with the speed or rhythm of the heartbeat. The monitor is a small, portable device. You can wear one while you do your normal daily activities. This is usually used to diagnose what is causing palpitations/syncope (passing out).

## 2013-02-12 NOTE — Progress Notes (Signed)
HPI: 79 year old female for evaluation of abnormal electrocardiogram and palpitations. Patient does not have significant dyspnea on exertion, orthopnea, PND. She has had mild pedal edema in the left lower extremity for several years. Over the past 2 years she has had intermittent palpitations described as her heart pounding. She's had mild dizziness but no frank syncope. She also has vague chest pain. It is almost continuous. She has weakness as well. She was noted to have an abnormal electrocardiogram and we're asked to evaluate.   Current Outpatient Prescriptions  Medication Sig Dispense Refill  . ALPRAZolam (XANAX) 0.5 MG tablet Take 0.5 mg by mouth at bedtime as needed for sleep.      . Ascorbic Acid (VITAMIN C) 1000 MG tablet Take 1,000 mg by mouth daily.      Marland Kitchen aspirin 81 MG tablet Take 81 mg by mouth daily.      . Calcium-Magnesium-Vitamin D (CALCIUM MAGNESIUM PO) Take 1 tablet by mouth daily.      . Cholecalciferol (VITAMIN D-3) 5000 UNITS TABS Take by mouth.      . cyanocobalamin 1000 MCG tablet Take 100 mcg by mouth daily.      Marland Kitchen levothyroxine (SYNTHROID, LEVOTHROID) 75 MCG tablet Take 75 mcg by mouth daily.      Marland Kitchen losartan (COZAAR) 25 MG tablet Take 25 mg by mouth daily.      . Magnesium Oxide (PHILLIPS PO) Take by mouth as needed.      . Multiple Vitamins-Minerals (ICAPS PO) Take 1 tablet by mouth daily.      . predniSONE (DELTASONE) 5 MG tablet Take 5 mg by mouth as needed.      . ranitidine (ZANTAC) 300 MG capsule Take 300 mg by mouth 2 (two) times daily.      . traMADol (ULTRAM) 50 MG tablet Take 50 mg by mouth every 6 (six) hours as needed for pain.       No current facility-administered medications for this visit.    No Known Allergies  Past Medical History  Diagnosis Date  . Hypothyroid   . Diverticulitis   . Hypertension   . Hyperlipidemia   . Murmur   . GERD (gastroesophageal reflux disease)   . Endometriosis     Past Surgical History  Procedure Laterality  Date  . Abdominal hysterectomy    . Appendectomy    . Back surgery    . Tonsillectomy    . Ankle surgery    . Knee surgery      History   Social History  . Marital Status: Widowed    Spouse Name: N/A    Number of Children: 3  . Years of Education: N/A   Occupational History  . Not on file.   Social History Main Topics  . Smoking status: Former Games developer  . Smokeless tobacco: Not on file  . Alcohol Use: No  . Drug Use: Not on file  . Sexually Active: Not on file   Other Topics Concern  . Not on file   Social History Narrative  . No narrative on file    Family History  Problem Relation Age of Onset  . Heart disease Sister     CHF in her 59s    ROS: some weakness and fatigue but no fevers or chills, productive cough, hemoptysis, dysphasia, odynophagia, melena, hematochezia, dysuria, hematuria, rash, seizure activity, orthopnea, PND, claudication. Remaining systems are negative.  Physical Exam:   Blood pressure 160/80, pulse 68, weight 115 lb (52.164 kg).  General:  Well developed/well nourished in NAD Skin warm/dry Patient not depressed No peripheral clubbing Back-normal HEENT-normal/normal eyelids Neck supple/normal carotid upstroke bilaterally; no bruits; no JVD; no thyromegaly chest - CTA/ normal expansion CV - RRR/normal S1 and S2; no rubs or gallops;  PMI nondisplaced, 3/6 systolic murmur left sternal border. S2 is diminished. Abdomen -NT/ND, no HSM, no mass, + bowel sounds, no bruit 2+ femoral pulses, no bruits Ext-1+ankle edema left lower extremity, no chords, 2+ DP Neuro-grossly nonfocal  ECG sinus rhythm at a rate of 68. Left bundle branch block.

## 2013-02-13 ENCOUNTER — Encounter: Payer: Self-pay | Admitting: Cardiology

## 2013-02-24 ENCOUNTER — Encounter (HOSPITAL_COMMUNITY): Payer: Medicare Other

## 2013-02-26 ENCOUNTER — Other Ambulatory Visit (HOSPITAL_COMMUNITY): Payer: Medicare Other

## 2013-04-09 ENCOUNTER — Ambulatory Visit: Payer: Medicare Other | Admitting: Cardiology

## 2013-07-25 ENCOUNTER — Other Ambulatory Visit: Payer: Self-pay | Admitting: Internal Medicine

## 2013-07-25 DIAGNOSIS — F4323 Adjustment disorder with mixed anxiety and depressed mood: Secondary | ICD-10-CM

## 2013-07-25 NOTE — Telephone Encounter (Signed)
RX called in .

## 2013-08-13 ENCOUNTER — Other Ambulatory Visit: Payer: Self-pay | Admitting: Internal Medicine

## 2013-08-14 ENCOUNTER — Telehealth: Payer: Self-pay | Admitting: Internal Medicine

## 2013-08-14 ENCOUNTER — Other Ambulatory Visit: Payer: Self-pay | Admitting: Internal Medicine

## 2013-08-14 DIAGNOSIS — I10 Essential (primary) hypertension: Secondary | ICD-10-CM

## 2013-08-14 MED ORDER — LOSARTAN POTASSIUM 25 MG PO TABS: 25.0000 mg | ORAL_TABLET | Freq: Every day | ORAL | Status: DC

## 2013-08-14 NOTE — Telephone Encounter (Signed)
PT TAKES LORSARTIN 5MG .  1 QD .  THIS WAS ORIGINALLY FILLED BY DR Maisie Fus AT [facility], SHE WENT ONCE BECAUSE IT WAS CLOSE TO HER HOME. NEVER WENT BACK.  PT NEEDS RX FOR THIS MEDICINE,  PHARM: [facility]  PLEASE ADVISE PT.  NEXT APPT: 09-08-13 DR MCK OV

## 2013-08-15 NOTE — Telephone Encounter (Signed)
Pt aware rx Lorsartin called in per Dr Oneta Rack

## 2013-09-02 ENCOUNTER — Encounter: Payer: Self-pay | Admitting: Internal Medicine

## 2013-09-08 ENCOUNTER — Encounter: Payer: Self-pay | Admitting: Emergency Medicine

## 2013-09-08 ENCOUNTER — Ambulatory Visit (INDEPENDENT_AMBULATORY_CARE_PROVIDER_SITE_OTHER): Payer: Medicare Other | Admitting: Emergency Medicine

## 2013-09-08 VITALS — BP 164/82 | HR 86 | Temp 98.4°F | Resp 16 | Ht 61.0 in | Wt 114.0 lb

## 2013-09-08 DIAGNOSIS — G609 Hereditary and idiopathic neuropathy, unspecified: Secondary | ICD-10-CM

## 2013-09-08 DIAGNOSIS — I10 Essential (primary) hypertension: Secondary | ICD-10-CM

## 2013-09-08 DIAGNOSIS — E559 Vitamin D deficiency, unspecified: Secondary | ICD-10-CM

## 2013-09-08 DIAGNOSIS — R7309 Other abnormal glucose: Secondary | ICD-10-CM

## 2013-09-08 DIAGNOSIS — E538 Deficiency of other specified B group vitamins: Secondary | ICD-10-CM

## 2013-09-08 DIAGNOSIS — E039 Hypothyroidism, unspecified: Secondary | ICD-10-CM

## 2013-09-08 DIAGNOSIS — E782 Mixed hyperlipidemia: Secondary | ICD-10-CM

## 2013-09-08 LAB — HEPATIC FUNCTION PANEL
ALT: 21 U/L (ref 0–35)
Albumin: 4.1 g/dL (ref 3.5–5.2)
Indirect Bilirubin: 0.5 mg/dL (ref 0.0–0.9)
Total Protein: 6.8 g/dL (ref 6.0–8.3)

## 2013-09-08 LAB — CBC WITH DIFFERENTIAL/PLATELET
Basophils Absolute: 0 10*3/uL (ref 0.0–0.1)
Eosinophils Absolute: 0.1 10*3/uL (ref 0.0–0.7)
Lymphocytes Relative: 34 % (ref 12–46)
Lymphs Abs: 2.2 10*3/uL (ref 0.7–4.0)
MCH: 30 pg (ref 26.0–34.0)
Neutrophils Relative %: 54 % (ref 43–77)
Platelets: 239 10*3/uL (ref 150–400)
RBC: 4.74 MIL/uL (ref 3.87–5.11)
RDW: 14.7 % (ref 11.5–15.5)
WBC: 6.6 10*3/uL (ref 4.0–10.5)

## 2013-09-08 LAB — BASIC METABOLIC PANEL WITH GFR
BUN: 21 mg/dL (ref 6–23)
CO2: 28 mEq/L (ref 19–32)
Calcium: 9.8 mg/dL (ref 8.4–10.5)
Chloride: 99 mEq/L (ref 96–112)
Creat: 0.99 mg/dL (ref 0.50–1.10)
Glucose, Bld: 98 mg/dL (ref 70–99)

## 2013-09-08 LAB — LIPID PANEL
Cholesterol: 256 mg/dL — ABNORMAL HIGH (ref 0–200)
HDL: 73 mg/dL (ref >39)
Triglycerides: 138 mg/dL (ref <150)

## 2013-09-08 LAB — VITAMIN B12: Vitamin B-12: 756 pg/mL (ref 211–911)

## 2013-09-08 LAB — TSH: TSH: 2.278 u[IU]/mL (ref 0.350–4.500)

## 2013-09-08 NOTE — Progress Notes (Signed)
Subjective:    Patient ID: Kristie [name], female    DOB: 20-Mar-1923, 79 y.o.   MRN: [id number]  HPI Comments: 79 yo pleasant female presents for 3 month F/U for HTN, Cholesterol, Pre-Dm, D. Deficient. LAST ABN LABS A1C 6.0 BS 278 T 252 LDL 148  BP 116/ 70 today but has been fluctuating up and down with out trigger. She was put on MF for elevated BS at last Ov and Celexa for depression but D/C metformin and Celexa because difficulty with urinating and felt ill while on RX. She has been eating descent except for the holidays. She has not been as active with cold weather, but tries to walk for exercise.   She continues to have mild depression but does not want to try another RX. She notes she gets overwhelmed because she has difficulty with certain daily activities and vision decreasing makes things more challenging. She notes she has a good support system but doesn't want to be a bother to anyone. She notes her memory is good most days but occasionally slower to recall or get out her complete thoughts.  She notes symptoms are worse when she does not sleep well. She notes she has always had mild difficulty with sleep due to increased pain with back if lies down for more than 5 hours.  She still has difficulty with peripheral neuropathy and 1 tramadol a day is not completely relieving her symptoms. She notes 08/15/13 2 cortisone shot at Orth[facility] unable to do surgery due to age. No relief with recent injections with arm pain/ neuropathy.She is having difficulty falling asleep even with xanax/ Vicodin. Tramadol seems to help more with pain, takes 1 a day.  Hypertension   Current Outpatient Prescriptions on File Prior to Visit  Medication Sig Dispense Refill  . traMADol (ULTRAM) 50 MG tablet Take 50 mg by mouth every 6 (six) hours as needed for pain.      Marland Kitchen ALPRAZolam (XANAX) 0.5 MG tablet Take 0.5 mg by mouth at bedtime as needed for sleep.      Marland Kitchen ALPRAZolam (XANAX) 1 MG tablet TAKE ONE (1) TABLET  THREE (3) TIMES EACHDAY AS NEEDED  90 tablet  0  . Ascorbic Acid (VITAMIN C) 1000 MG tablet Take 1,000 mg by mouth daily.      Marland Kitchen aspirin 81 MG tablet Take 81 mg by mouth daily.      . Calcium-Magnesium-Vitamin D (CALCIUM MAGNESIUM PO) Take 1 tablet by mouth daily.      . Cholecalciferol (VITAMIN D-3) 5000 UNITS TABS Take by mouth.      . cyanocobalamin 1000 MCG tablet Take 100 mcg by mouth daily.      Marland Kitchen levothyroxine (SYNTHROID, LEVOTHROID) 75 MCG tablet Take 75 mcg by mouth daily.      Marland Kitchen losartan (COZAAR) 25 MG tablet Take 1 tablet (25 mg total) by mouth daily. For Blood Pressure  90 tablet  99  . Magnesium Oxide (PHILLIPS PO) Take by mouth as needed.      . Multiple Vitamins-Minerals (ICAPS PO) Take 1 tablet by mouth daily.      . predniSONE (DELTASONE) 5 MG tablet TAKE 1 TABLET 3 TIMES DAILY OR AS NEEDED  100 tablet  0  . ranitidine (ZANTAC) 300 MG capsule Take 300 mg by mouth 2 (two) times daily.       No current facility-administered medications on file prior to visit.   ALLERGIES Celexa; Levaquin; Red yeast rice extract; and Zocor  Past Medical History  Diagnosis Date  . Hypothyroid   . Diverticulitis   . Hypertension   . Hyperlipidemia   . Murmur   . GERD (gastroesophageal reflux disease)   . Endometriosis   . Pre-diabetes      Review of Systems  Constitutional: Positive for fatigue.  Eyes: Positive for visual disturbance.  Musculoskeletal: Positive for arthralgias.  Neurological: Positive for weakness.  Psychiatric/Behavioral: Positive for suicidal ideas and sleep disturbance. The patient is nervous/anxious.   All other systems reviewed and are negative.   BP 164/82  Pulse 86  Temp(Src) 98.4 F (36.9 C) (Temporal)  Resp 16  Ht 5\' 1"  (1.549 m)  Wt 114 lb (51.71 kg)  BMI 21.55 kg/m2     Objective:   Physical Exam  Nursing note and vitals reviewed. Constitutional: She is oriented to person, place, and time. She appears well-developed and well-nourished. No  distress.  HENT:  Head: Normocephalic and atraumatic.  Right Ear: External ear normal.  Left Ear: External ear normal.  Nose: Nose normal.  Mouth/Throat: Oropharynx is clear and moist.  Eyes: Conjunctivae and EOM are normal.  Neck: Normal range of motion. Neck supple. No JVD present. No thyromegaly present.  Cardiovascular: Normal rate, regular rhythm, normal heart sounds and intact distal pulses.   Pulmonary/Chest: Effort normal and [name] sounds normal.  Abdominal: Soft. Bowel sounds are normal. She exhibits no distension and no mass. There is no tenderness. There is no rebound and no guarding.  Musculoskeletal: Normal range of motion. She exhibits no edema and no tenderness.  Mildly unstable gait  Lymphadenopathy:    She has no cervical adenopathy.  Neurological: She is alert and oriented to person, place, and time. No cranial nerve deficit.  Skin: Skin is warm and dry. No rash noted. No erythema. No pallor.  Psychiatric: She has a normal mood and affect. Her behavior is normal. Judgment and thought content normal.  Tearful          Assessment & Plan:  1.  3 month F/U for HTN, Cholesterol, Pre-Dm, D. Deficient. Needs healthy diet, cardio QD and obtain healthy weight. Check Labs, Check BP if >130/80 call office If BP >140/80 take Losartan AD.  2. Neuropathy with Cervical DDD and mild depression- Advised needs counseling and continue f/u at Ortho. She also needs increased activity, water exercises. Advised needs to check with her church family about assistance with shopping and repairs. Advised Okay to increase Tramadol to BID to help with pain. W/C if depression increases for trial of new RX. Advised to use lists to help with tasks/ memory.   3. Insomnia- Sleep hygiene explained, discussed need for activity in daytime and less napping. OVER 40 minutes of exam, counseling, chart review, referral performed

## 2013-09-08 NOTE — Patient Instructions (Signed)
Diabetes Meal Planning Guide The diabetes meal planning guide is a tool to help you plan your meals and snacks. It is important for people with diabetes to manage their blood glucose (sugar) levels. Choosing the right foods and the right amounts throughout your day will help control your blood glucose. Eating right can even help you improve your blood pressure and reach or maintain a healthy weight. CARBOHYDRATE COUNTING MADE EASY When you eat carbohydrates, they turn to sugar. This raises your blood glucose level. Counting carbohydrates can help you control this level so you feel better. When you plan your meals by counting carbohydrates, you can have more flexibility in what you eat and balance your medicine with your food intake. Carbohydrate counting simply means adding up the total amount of carbohydrate grams in your meals and snacks. Try to eat about the same amount at each meal. Foods with carbohydrates are listed below. Each portion below is 1 carbohydrate serving or 15 grams of carbohydrates. Ask your dietician how many grams of carbohydrates you should eat at each meal or snack. Grains and Starches  1 slice bread.   English muffin or hotdog/hamburger bun.   cup cold cereal (unsweetened).   cup cooked pasta or rice.   cup starchy vegetables (corn, potatoes, peas, beans, winter squash).  1 tortilla (6 inches).   bagel.  1 waffle or pancake (size of a CD).   cup cooked cereal.  4 to 6 small crackers. *Whole grain is recommended. Fruit  1 cup fresh unsweetened berries, melon, papaya, pineapple.  1 small fresh fruit.   banana or mango.   cup fruit juice (4 oz unsweetened).   cup canned fruit in natural juice or water.  2 tbs dried fruit.  12 to 15 grapes or cherries. Milk and Yogurt  1 cup fat-free or 1% milk.  1 cup soy milk.  6 oz light yogurt with sugar-free sweetener.  6 oz low-fat soy yogurt.  6 oz plain yogurt. Vegetables  1 cup raw or  cup  cooked is counted as 0 carbohydrates or a "free" food.  If you eat 3 or more servings at 1 meal, count them as 1 carbohydrate serving. Other Carbohydrates   oz chips or pretzels.   cup ice cream or frozen yogurt.   cup sherbet or sorbet.  2 inch square cake, no frosting.  1 tbs honey, sugar, jam, jelly, or syrup.  2 small cookies.  3 squares of graham crackers.  3 cups popcorn.  6 crackers.  1 cup broth-based soup.  Count 1 cup casserole or other mixed foods as 2 carbohydrate servings.  Foods with less than 20 calories in a serving may be counted as 0 carbohydrates or a "free" food. You may want to purchase a book or computer software that lists the carbohydrate gram counts of different foods. In addition, the nutrition facts panel on the labels of the foods you eat are a good source of this information. The label will tell you how big the serving size is and the total number of carbohydrate grams you will be eating per serving. Divide this number by 15 to obtain the number of carbohydrate servings in a portion. Remember, 1 carbohydrate serving equals 15 grams of carbohydrate. SERVING SIZES Measuring foods and serving sizes helps you make sure you are getting the right amount of food. The list below tells how big or small some common serving sizes are.  1 oz.........4 stacked dice.  3 oz.........Deck of cards.  1 tsp........Tip   of little finger.  1 tbs......Marland KitchenMarland KitchenThumb.  2 tbs.......Marland KitchenGolf ball.   cup......Marland KitchenHalf of a fist.  1 cup.......Marland KitchenA fist. SAMPLE DIABETES MEAL PLAN Below is a sample meal plan that includes foods from the grain and starches, dairy, vegetable, fruit, and meat groups. A dietician can individualize a meal plan to fit your calorie needs and tell you the number of servings needed from each food group. However, controlling the total amount of carbohydrates in your meal or snack is more important than making sure you include all of the food groups at every  meal. You may interchange carbohydrate containing foods (dairy, starches, and fruits). The meal plan below is an example of a 2000 calorie diet using carbohydrate counting. This meal plan has 17 carbohydrate servings. Breakfast  1 cup oatmeal (2 carb servings).   cup light yogurt (1 carb serving).  1 cup blueberries (1 carb serving).   cup almonds. Snack  1 large apple (2 carb servings).  1 low-fat string cheese stick. Lunch  Chicken breast salad.  1 cup spinach.   cup chopped tomatoes.  2 oz chicken breast, sliced.  2 tbs low-fat [place] dressing.  12 whole-wheat crackers (2 carb servings).  12 to 15 grapes (1 carb serving).  1 cup low-fat milk (1 carb serving). Snack  1 cup carrots.   cup hummus (1 carb serving). Dinner  3 oz broiled salmon.  1 cup brown rice (3 carb servings). Snack  1  cups steamed broccoli (1 carb serving) drizzled with 1 tsp olive oil and lemon juice.  1 cup light pudding (2 carb servings). DIABETES MEAL PLANNING WORKSHEET Your dietician can use this worksheet to help you decide how many servings of foods and what types of foods are right for you.  BREAKFAST Food Group and Servings / Carb Servings Grain/Starches __________________________________ Dairy __________________________________________ Vegetable ______________________________________ Fruit ___________________________________________ Meat __________________________________________ Fat ____________________________________________ LUNCH Food Group and Servings / Carb Servings Grain/Starches ___________________________________ Dairy ___________________________________________ Fruit ____________________________________________ Meat ___________________________________________ Fat _____________________________________________ Laural Golden Food Group and Servings / Carb Servings Grain/Starches ___________________________________ Dairy  ___________________________________________ Fruit ____________________________________________ Meat ___________________________________________ Fat _____________________________________________ SNACKS Food Group and Servings / Carb Servings Grain/Starches ___________________________________ Dairy ___________________________________________ Vegetable _______________________________________ Fruit ____________________________________________ Meat ___________________________________________ Fat _____________________________________________ DAILY TOTALS Starches _________________________ Vegetable ________________________ Fruit ____________________________ Dairy ____________________________ Meat ____________________________ Fat ______________________________ Document Released: 05/25/2005 Document Revised: 11/20/2011 Document Reviewed: 04/05/2009 [employer] Patient Information 2014 [employer] Neuropathic Pain We often think that pain has a physical cause. If we get rid of the cause, the pain should go away. Nerves themselves can also cause pain. It is called neuropathic pain, which means nerve abnormality. It may be difficult for the patients who have it and for the treating caregivers. Pain is usually described as acute (short-lived) or chronic (long-lasting). Acute pain is related to the physical sensations caused by an injury. It can last from a few seconds to many weeks, but it usually goes away when normal healing occurs. Chronic pain lasts beyond the typical healing time. With neuropathic pain, the nerve fibers themselves may be damaged or injured. They then send incorrect signals to other pain centers. The pain you feel is real, but the cause is not easy to find.  CAUSES  Chronic pain can result from diseases, such as diabetes and shingles (an infection related to chickenpox), or from trauma, surgery, or amputation. It can also happen without any known injury or disease. The nerves are  sending pain messages, even though there is no identifiable cause for such messages.   Other common  causes of neuropathy include diabetes, phantom limb pain, or Regional Pain Syndrome (RPS).  As with all forms of chronic back pain, if neuropathy is not correctly treated, there can be a number of associated problems that lead to a downward cycle for the patient. These include depression, sleeplessness, feelings of fear and anxiety, limited social interaction and inability to do normal daily activities or work.  The most dramatic and mysterious example of neuropathic pain is called "phantom limb syndrome." This occurs when an arm or a leg has been removed because of illness or injury. The brain still gets pain messages from the nerves that originally carried impulses from the missing limb. These nerves now seem to misfire and cause troubling pain.  Neuropathic pain often seems to have no cause. It responds poorly to standard pain treatment. Neuropathic pain can occur after:  Shingles (herpes zoster virus infection).  A lasting burning sensation of the skin, caused usually by injury to a peripheral nerve.  Peripheral neuropathy which is widespread nerve damage, often caused by diabetes or alcoholism.  Phantom limb pain following an amputation.  Facial nerve problems (trigeminal neuralgia).  Multiple sclerosis.  Reflex sympathetic dystrophy.  Pain which comes with cancer and cancer chemotherapy.  Entrapment neuropathy such as when pressure is put on a nerve such as in carpal tunnel syndrome.  Back, leg, and hip problems (sciatica).  Spine or back surgery.  HIV Infection or AIDS where nerves are infected by viruses. Your caregiver can explain items in the above list which may apply to you. SYMPTOMS  Characteristics of neuropathic pain are:  Severe, sharp, electric shock-like, shooting, lightening-like, knife-like.  Pins and needles sensation.  Deep burning, deep cold, or deep  ache.  Persistent numbness, tingling, or weakness.  Pain resulting from light touch or other stimulus that would not usually cause pain.  Increased sensitivity to something that would normally cause pain, such as a pinprick. Pain may persist for months or years following the healing of damaged tissues. When this happens, pain signals no longer sound an alarm about current injuries or injuries about to happen. Instead, the alarm system itself is not working correctly.  Neuropathic pain may get worse instead of better over time. For some people, it can lead to serious disability. It is important to be aware that severe injury in a limb can occur without a proper, protective pain response.Burns, cuts, and other injuries may go unnoticed. Without proper treatment, these injuries can become infected or lead to further disability. Take any injury seriously, and consult your caregiver for treatment. DIAGNOSIS  When you have a pain with no known cause, your caregiver will probably ask some specific questions:   Do you have any other conditions, such as diabetes, shingles, multiple sclerosis, or HIV infection?  How would you describe your pain? (Neuropathic pain is often described as shooting, stabbing, burning, or searing.)  Is your pain worse at any time of the day? (Neuropathic pain is usually worse at night.)  Does the pain seem to follow a certain physical pathway?  Does the pain come from an area that has missing or injured nerves? (An example would be phantom limb pain.)  Is the pain triggered by minor things such as rubbing against the sheets at night? These questions often help define the type of pain involved. Once your caregiver knows what is happening, treatment can begin. Anticonvulsant, antidepressant drugs, and various pain relievers seem to work in some cases. If another condition, such as diabetes is involved,  better management of that disorder may relieve the neuropathic pain.   TREATMENT  Neuropathic pain is frequently long-lasting and tends not to respond to treatment with narcotic type pain medication. It may respond well to other drugs such as antiseizure and antidepressant medications. Usually, neuropathic problems do not completely go away, but partial improvement is often possible with proper treatment. Your caregivers have large numbers of medications available to treat you. Do not be discouraged if you do not get immediate relief. Sometimes different medications or a combination of medications will be tried before you receive the results you are hoping for. See your caregiver if you have pain that seems to be coming from nowhere and does not go away. Help is available.  SEEK IMMEDIATE MEDICAL CARE IF:   There is a sudden change in the quality of your pain, especially if the change is on only one side of the body.  You notice changes of the skin, such as redness, black or purple discoloration, swelling, or an ulcer.  You cannot move the affected limbs. Document Released: 05/25/2004 Document Revised: 11/20/2011 Document Reviewed: 05/25/2004 [facility] Patient Information 2014 [place], [place].

## 2013-09-09 LAB — VITAMIN D 25 HYDROXY (VIT D DEFICIENCY, FRACTURES): Vit D, 25-Hydroxy: 51 ng/mL (ref 30–89)

## 2013-09-10 DIAGNOSIS — E782 Mixed hyperlipidemia: Secondary | ICD-10-CM | POA: Insufficient documentation

## 2013-09-12 ENCOUNTER — Other Ambulatory Visit: Payer: Self-pay | Admitting: *Deleted

## 2013-09-19 ENCOUNTER — Other Ambulatory Visit: Payer: Self-pay | Admitting: Physician Assistant

## 2013-09-19 ENCOUNTER — Other Ambulatory Visit: Payer: Self-pay | Admitting: Internal Medicine

## 2013-09-19 ENCOUNTER — Other Ambulatory Visit: Payer: Self-pay | Admitting: Emergency Medicine

## 2013-09-19 MED ORDER — ALPRAZOLAM 1 MG PO TABS: 1.0000 mg | ORAL_TABLET | Freq: Three times a day (TID) | ORAL | Status: DC | PRN

## 2013-09-30 ENCOUNTER — Other Ambulatory Visit: Payer: Self-pay | Admitting: Internal Medicine

## 2013-11-14 ENCOUNTER — Other Ambulatory Visit: Payer: Self-pay | Admitting: Emergency Medicine

## 2013-11-14 MED ORDER — TRAMADOL HCL 50 MG PO TABS: ORAL_TABLET | ORAL | Status: DC

## 2013-12-08 ENCOUNTER — Encounter: Payer: Self-pay | Admitting: Internal Medicine

## 2013-12-08 ENCOUNTER — Ambulatory Visit (INDEPENDENT_AMBULATORY_CARE_PROVIDER_SITE_OTHER): Payer: Commercial Managed Care - HMO | Admitting: Internal Medicine

## 2013-12-08 VITALS — BP 136/70 | HR 80 | Temp 98.1°F | Resp 16 | Ht 60.75 in | Wt 113.0 lb

## 2013-12-08 DIAGNOSIS — R7309 Other abnormal glucose: Secondary | ICD-10-CM

## 2013-12-08 DIAGNOSIS — M159 Polyosteoarthritis, unspecified: Secondary | ICD-10-CM | POA: Insufficient documentation

## 2013-12-08 DIAGNOSIS — Z1331 Encounter for screening for depression: Secondary | ICD-10-CM

## 2013-12-08 DIAGNOSIS — E559 Vitamin D deficiency, unspecified: Secondary | ICD-10-CM | POA: Insufficient documentation

## 2013-12-08 DIAGNOSIS — Z0000 Encounter for general adult medical examination without abnormal findings: Secondary | ICD-10-CM

## 2013-12-08 DIAGNOSIS — Z789 Other specified health status: Secondary | ICD-10-CM

## 2013-12-08 DIAGNOSIS — Z1212 Encounter for screening for malignant neoplasm of rectum: Secondary | ICD-10-CM

## 2013-12-08 DIAGNOSIS — Z79899 Other long term (current) drug therapy: Secondary | ICD-10-CM

## 2013-12-08 DIAGNOSIS — R7303 Prediabetes: Secondary | ICD-10-CM | POA: Insufficient documentation

## 2013-12-08 DIAGNOSIS — I10 Essential (primary) hypertension: Secondary | ICD-10-CM

## 2013-12-08 DIAGNOSIS — E782 Mixed hyperlipidemia: Secondary | ICD-10-CM

## 2013-12-08 LAB — CBC WITH DIFFERENTIAL/PLATELET
BASOS ABS: 0.1 10*3/uL (ref 0.0–0.1)
Basophils Relative: 1 % (ref 0–1)
Eosinophils Absolute: 0.1 10*3/uL (ref 0.0–0.7)
Eosinophils Relative: 1 % (ref 0–5)
HEMATOCRIT: 43.3 % (ref 36.0–46.0)
HEMOGLOBIN: 14.4 g/dL (ref 12.0–15.0)
LYMPHS PCT: 45 % (ref 12–46)
Lymphs Abs: 2.9 10*3/uL (ref 0.7–4.0)
MCH: 29.8 pg (ref 26.0–34.0)
MCHC: 33.3 g/dL (ref 30.0–36.0)
MCV: 89.6 fL (ref 78.0–100.0)
MONO ABS: 0.6 10*3/uL (ref 0.1–1.0)
Monocytes Relative: 9 % (ref 3–12)
NEUTROS PCT: 44 % (ref 43–77)
Neutro Abs: 2.9 10*3/uL (ref 1.7–7.7)
Platelets: 249 10*3/uL (ref 150–400)
RBC: 4.83 MIL/uL (ref 3.87–5.11)
RDW: 13.9 % (ref 11.5–15.5)
WBC: 6.5 10*3/uL (ref 4.0–10.5)

## 2013-12-08 NOTE — Patient Instructions (Signed)
Osteoarthritis Osteoarthritis is a disease that causes soreness and swelling (inflammation) of a joint. It occurs when the cartilage at the affected joint wears down. Cartilage acts as a cushion, covering the ends of bones where they meet to form a joint. Osteoarthritis is the most common form of arthritis. It often occurs in older people. The joints affected most often by this condition include those in the:  Ends of the fingers.  Thumbs.  Neck.  Lower back.  Knees.  Hips. CAUSES  Over time, the cartilage that covers the ends of bones begins to wear away. This causes bone to rub on bone, producing pain and stiffness in the affected joints.  RISK FACTORS Certain factors can increase your chances of having osteoarthritis, including:  Older age.  Excessive body weight.  Overuse of joints. SIGNS AND SYMPTOMS   Pain, swelling, and stiffness in the joint.  Over time, the joint may lose its normal shape.  Small deposits of bone (osteophytes) may grow on the edges of the joint.  Bits of bone or cartilage can break off and float inside the joint space. This may cause more pain and damage. DIAGNOSIS  Your health care provider will do a physical exam and ask about your symptoms. Various tests may be ordered, such as:  X-rays of the affected joint.  An MRI scan.  Blood tests to rule out other types of arthritis.  Joint fluid tests. This involves using a needle to draw fluid from the joint and examining the fluid under a microscope. TREATMENT  Goals of treatment are to control pain and improve joint function. Treatment plans may include:  A prescribed exercise program that allows for rest and joint relief.  A weight control plan.  Pain relief techniques, such as:  Properly applied heat and cold.  Electric pulses delivered to nerve endings under the skin (transcutaneous electrical nerve stimulation, TENS).  Massage.  Certain nutritional supplements.  Medicines to  control pain, such as:  Acetaminophen.  Nonsteroidal anti-inflammatory drugs (NSAIDs), such as naproxen.  Narcotic or central-acting agents, such as tramadol.  Corticosteroids. These can be given orally or as an injection.  Surgery to reposition the bones and relieve pain (osteotomy) or to remove loose pieces of bone and cartilage. Joint replacement may be needed in advanced states of osteoarthritis. HOME CARE INSTRUCTIONS   Only take over-the-counter or prescription medicines as directed by your health care provider. Take all medicines exactly as instructed.  Maintain a healthy weight. Follow your health care provider's instructions for weight control. This may include dietary instructions.  Exercise as directed. Your health care provider can recommend specific types of exercise. These may include:  Strengthening exercises These are done to strengthen the muscles that support joints affected by arthritis. They can be performed with weights or with exercise bands to add resistance.  Aerobic activities These are exercises, such as brisk walking or low-impact aerobics, that get your heart pumping.  Range-of-motion activities These keep your joints limber.  Balance and agility exercises These help you maintain daily living skills.  Rest your affected joints as directed by your health care provider.  Follow up with your health care provider as directed. SEEK MEDICAL CARE IF:   Your skin turns red.  You develop a rash in addition to your joint pain.  You have worsening joint pain. SEEK IMMEDIATE MEDICAL CARE IF:  You have a significant loss of weight or appetite.  You have a fever along with joint or muscle aches.  You have   night sweats. FOR MORE INFORMATION  National Institute of Arthritis and Musculoskeletal and Skin Diseases: www.niams.nih.gov National Institute on Aging: www.nia.nih.gov American College of Rheumatology: www.rheumatology.org Document Released: 08/28/2005  Document Revised: 06/18/2013 Document Reviewed: 05/05/2013 [employer] Patient Information 2014 [employer].   Hypertension As your heart beats, it forces blood through your arteries. This force is your blood pressure. If the pressure is too high, it is called hypertension (HTN) or high blood pressure. HTN is dangerous because you may have it and not know it. High blood pressure may mean that your heart has to work harder to pump blood. Your arteries may be narrow or stiff. The extra work puts you at risk for heart disease, stroke, and other problems.  Blood pressure consists of two numbers, a higher number over a lower, 110/72, for example. It is stated as "110 over 72." The ideal is below 120 for the top number (systolic) and under 80 for the bottom (diastolic). Write down your blood pressure today. You should pay close attention to your blood pressure if you have certain conditions such as:  Heart failure.  Prior heart attack.  Diabetes  Chronic kidney disease.  Prior stroke.  Multiple risk factors for heart disease. To see if you have HTN, your blood pressure should be measured while you are seated with your arm held at the level of the heart. It should be measured at least twice. A one-time elevated blood pressure reading (especially in the Emergency Department) does not mean that you need treatment. There may be conditions in which the blood pressure is different between your right and left arms. It is important to see your caregiver soon for a recheck. Most people have essential hypertension which means that there is not a specific cause. This type of high blood pressure may be lowered by changing lifestyle factors such as:  Stress.  Smoking.  Lack of exercise.  Excessive weight.  Drug/tobacco/alcohol use.  Eating less salt. Most people do not have symptoms from high blood pressure until it has caused damage to the body. Effective treatment can often prevent, delay or reduce  that damage. TREATMENT  When a cause has been identified, treatment for high blood pressure is directed at the cause. There are a large number of medications to treat HTN. These fall into several categories, and your caregiver will help you select the medicines that are best for you. Medications may have side effects. You should review side effects with your caregiver. If your blood pressure stays high after you have made lifestyle changes or started on medicines,   Your medication(s) may need to be changed.  Other problems may need to be addressed.  Be certain you understand your prescriptions, and know how and when to take your medicine.  Be sure to follow up with your caregiver within the time frame advised (usually within two weeks) to have your blood pressure rechecked and to review your medications.  If you are taking more than one medicine to lower your blood pressure, make sure you know how and at what times they should be taken. Taking two medicines at the same time can result in blood pressure that is too low. SEEK IMMEDIATE MEDICAL CARE IF:  You develop a severe headache, blurred or changing vision, or confusion.  You have unusual weakness or numbness, or a faint feeling.  You have severe chest or abdominal pain, vomiting, or breathing problems. MAKE SURE YOU:   Understand these instructions.  Will watch your condition.  Will   get help right away if you are not doing well or get worse.   Diabetes and Exercise Exercising regularly is important. It is not just about losing weight. It has many health benefits, such as:  Improving your overall fitness, flexibility, and endurance.  Increasing your bone density.  Helping with weight control.  Decreasing your body fat.  Increasing your muscle strength.  Reducing stress and tension.  Improving your overall health. People with diabetes who exercise gain additional benefits because exercise:  Reduces  appetite.  Improves the body's use of blood sugar (glucose).  Helps lower or control blood glucose.  Decreases blood pressure.  Helps control blood lipids (such as cholesterol and triglycerides).  Improves the body's use of the hormone insulin by:  Increasing the body's insulin sensitivity.  Reducing the body's insulin needs.  Decreases the risk for heart disease because exercising:  Lowers cholesterol and triglycerides levels.  Increases the levels of good cholesterol (such as high-density lipoproteins [HDL]) in the body.  Lowers blood glucose levels. YOUR ACTIVITY PLAN  Choose an activity that you enjoy and set realistic goals. Your health care provider or diabetes educator can help you make an activity plan that works for you. You can break activities into 2 or 3 sessions throughout the day. Doing so is as good as one long session. Exercise ideas include:  Taking the dog for a walk.  Taking the stairs instead of the elevator.  Dancing to your favorite song.  Doing your favorite exercise with a friend. RECOMMENDATIONS FOR EXERCISING WITH TYPE 1 OR TYPE 2 DIABETES   Check your blood glucose before exercising. If blood glucose levels are greater than 240 mg/dL, check for urine ketones. Do not exercise if ketones are present.  Avoid injecting insulin into areas of the body that are going to be exercised. For example, avoid injecting insulin into:  The arms when playing tennis.  The legs when jogging.  Keep a record of:  Food intake before and after you exercise.  Expected peak times of insulin action.  Blood glucose levels before and after you exercise.  The type and amount of exercise you have done.  Review your records with your health care provider. Your health care provider will help you to develop guidelines for adjusting food intake and insulin amounts before and after exercising.  If you take insulin or oral hypoglycemic agents, watch for signs and  symptoms of hypoglycemia. They include:  Dizziness.  Shaking.  Sweating.  Chills.  Confusion.  Drink plenty of water while you exercise to prevent dehydration or heat stroke. Body water is lost during exercise and must be replaced.  Talk to your health care provider before starting an exercise program to make sure it is safe for you. Remember, almost any type of activity is better than none.    Cholesterol Cholesterol is a white, waxy, fat-like protein needed by your body in small amounts. The liver makes all the cholesterol you need. It is carried from the liver by the blood through the blood vessels. Deposits (plaque) may build up on blood vessel walls. This makes the arteries narrower and stiffer. Plaque increases the risk for heart attack and stroke. You cannot feel your cholesterol level even if it is very high. The only way to know is by a blood test to check your lipid (fats) levels. Once you know your cholesterol levels, you should keep a record of the test results. Work with your caregiver to to keep your levels in the   desired range. WHAT THE RESULTS MEAN:  Total cholesterol is a rough measure of all the cholesterol in your blood.  LDL is the so-called bad cholesterol. This is the type that deposits cholesterol in the walls of the arteries. You want this level to be low.  HDL is the good cholesterol because it cleans the arteries and carries the LDL away. You want this level to be high.  Triglycerides are fat that the body can either burn for energy or store. High levels are closely linked to heart disease. DESIRED LEVELS:  Total cholesterol below 200.  LDL below 100 for people at risk, below 70 for very high risk.  HDL above 50 is good, above 60 is best.  Triglycerides below 150. HOW TO LOWER YOUR CHOLESTEROL:  Diet.  Choose fish or white meat chicken and turkey, roasted or baked. Limit fatty cuts of red meat, fried foods, and processed meats, such as sausage and  lunch meat.  Eat lots of fresh fruits and vegetables. Choose whole grains, beans, pasta, potatoes and cereals.  Use only small amounts of olive, corn or canola oils. Avoid butter, mayonnaise, shortening or palm kernel oils. Avoid foods with trans-fats.  Use skim/nonfat milk and low-fat/nonfat yogurt and cheeses. Avoid whole milk, cream, ice cream, egg yolks and cheeses. Healthy desserts include angel food cake, ginger snaps, animal crackers, hard candy, popsicles, and low-fat/nonfat frozen yogurt. Avoid pastries, cakes, pies and cookies.  Exercise.  A regular program helps decrease LDL and raises HDL.  Helps with weight control.  Do things that increase your activity level like gardening, walking, or taking the stairs.  Medication.  May be prescribed by your caregiver to help lowering cholesterol and the risk for heart disease.  You may need medicine even if your levels are normal if you have several risk factors. HOME CARE INSTRUCTIONS   Follow your diet and exercise programs as suggested by your caregiver.  Take medications as directed.  Have blood work done when your caregiver feels it is necessary. MAKE SURE YOU:   Understand these instructions.  Will watch your condition.  Will get help right away if you are not doing well or get worse.      Vitamin D Deficiency Vitamin D is an important vitamin that your body needs. Having too little of it in your body is called a deficiency. A very bad deficiency can make your bones soft and can cause a condition called rickets.  Vitamin D is important to your body for different reasons, such as:   It helps your body absorb 2 minerals called calcium and phosphorus.  It helps make your bones healthy.  It may prevent some diseases, such as diabetes and multiple sclerosis.  It helps your muscles and heart. You can get vitamin D in several ways. It is a natural part of some foods. The vitamin is also added to some dairy products  and cereals. Some people take vitamin D supplements. Also, your body makes vitamin D when you are in the sun. It changes the sun's rays into a form of the vitamin that your body can use. CAUSES   Not eating enough foods that contain vitamin D.  Not getting enough sunlight.  Having certain digestive system diseases that make it hard to absorb vitamin D. These diseases include Crohn's disease, chronic pancreatitis, and cystic fibrosis.  Having a surgery in which part of the stomach or small intestine is removed.  Being obese. Fat cells pull vitamin D out of your   blood. That means that obese people may not have enough vitamin D left in their blood and in other body tissues.  Having chronic kidney or liver disease. RISK FACTORS Risk factors are things that make you more likely to develop a vitamin D deficiency. They include:  Being older.  Not being able to get outside very much.  Living in a nursing home.  Having had broken bones.  Having weak or thin bones (osteoporosis).  Having a disease or condition that changes how your body absorbs vitamin D.  Having dark skin.  Some medicines such as seizure medicines or steroids.  Being overweight or obese. SYMPTOMS Mild cases of vitamin D deficiency may not have any symptoms. If you have a very bad case, symptoms may include:  Bone pain.  Muscle pain.  Falling often.  Broken bones caused by a minor injury, due to osteoporosis. DIAGNOSIS A blood test is the best way to tell if you have a vitamin D deficiency. TREATMENT Vitamin D deficiency can be treated in different ways. Treatment for vitamin D deficiency depends on what is causing it. Options include:  Taking vitamin D supplements.  Taking a calcium supplement. Your caregiver will suggest what dose is best for you. HOME CARE INSTRUCTIONS  Take any supplements that your caregiver prescribes. Follow the directions carefully. Take only the suggested amount.  Have your  blood tested 2 months after you start taking supplements.  Eat foods that contain vitamin D. Healthy choices include:  Fortified dairy products, cereals, or juices. Fortified means vitamin D has been added to the food. Check the label on the package to be sure.  Fatty fish like salmon or trout.  Eggs.  Oysters.  Do not use a tanning bed.  Keep your weight at a healthy level. Lose weight if you need to.  Keep all follow-up appointments. Your caregiver will need to perform blood tests to make sure your vitamin D deficiency is going away. SEEK MEDICAL CARE IF:  You have any questions about your treatment.  You continue to have symptoms of vitamin D deficiency.  You have nausea or vomiting.  You are constipated.  You feel confused.  You have severe abdominal or back pain. MAKE SURE YOU:  Understand these instructions.  Will watch your condition.  Will get help right away if you are not doing well or get worse.   

## 2013-12-08 NOTE — Progress Notes (Signed)
Patient ID: Kristie [name], female   DOB: 1923/02/08, 80 y.o.   MRN: [id number]   Annual Screening Comprehensive Examination  This very nice 80 y.o. WWF presents for complete physical.  Patient has been followed for HTN, Diabetes  Prediabetes, Hyperlipidemia, and Vitamin D Deficiency. Patient also relates today she no longer drives and feels unable to live alone and that her daughter is assisting her with a move to a retirement center.    HTN predates since 1997. Patient's BP has been controlled at home. Today's BP: 136/70 mmHg. Patient denies any cardiac symptoms as chest pain, palpitations, shortness of breath, dizziness or ankle swelling.   Patient is intolerant to statins an lopid and hyperlipidemia is not controlled with diet. Patient denies myalgias or other medication SE's. Last cholesterol last visit was 256, triglycerides 138, HDL 73 and LDL 155 in .     Patient has prediabetes with A1c 6.5% Mar 2010 and with last A1c 6.0% in Oct 2014. Patient denies reactive hypoglycemic symptoms, visual blurring, diabetic polys, or paresthesias.    Patient has DJD with chronic generalized pains of peripheral joints and axial spine and particularly c/o Bilat shoulder pains. She has been seen by both Dr Ophelia Charter and Dr Luiz Blare in the past.   Finally, patient has history of Vitamin D Deficiency of 21 in 2009 with last vitamin D 66 in Oct 2014.      Medication Sig  . ALPRAZolam (XANAX) 1 MG tablet Take 1 tablet (1 mg total) by mouth 3 (three) times daily as needed   . Cholecalciferol (VITAMIN D-3) 5000 UNITS TABS Take by mouth.  . levothyroxine (SYNTHROID, LEVOTHROID) 75 MCG tablet Take 75 mcg by mouth daily.  Marland Kitchen losartan (COZAAR) 25 MG tablet Take 1 tablet (25 mg total) by mouth daily. For Blood Pressure  . Magnesium Oxide (PHILLIPS PO) Take by mouth as needed.  . traMADol (ULTRAM) 50 MG tablet TAKE ONE (1) TABLET FOUR (4) TIMES EACH DAY  FOR PAIN   Allergies  Allergen Reactions  . Celexa  [Citalopram Hydrobromide] Nausea And Vomiting  . Levaquin [Levofloxacin In D5w] Other (See Comments)    Hearing loss  . Red Yeast Rice Extract [Cholestin] Nausea Only  . Zocor [Simvastatin] Other (See Comments)    Elevated LFT   Past Medical History  Diagnosis Date  . Hypothyroid   . Diverticulitis   . Hypertension   . Hyperlipidemia   . Murmur   . GERD (gastroesophageal reflux disease)   . Endometriosis   . Pre-diabetes     Past Surgical History  Procedure Laterality Date  . Abdominal hysterectomy    . Appendectomy    . Back surgery    . Tonsillectomy    . Ankle surgery    . Knee surgery     Family History  Problem Relation Age of Onset  . Heart disease Sister     CHF in her 34s  . Heart disease Mother   . Diabetes Father   . Heart disease Father     History  Substance Use Topics  . Smoking status: Never Smoker   . Smokeless tobacco: Not on file  . Alcohol Use: No    ROS Constitutional: Denies fever, chills, weight loss/gain, headaches, insomnia, fatigue, night sweats, and change in appetite. Eyes: Denies redness, blurred vision, diplopia, discharge, itchy, watery eyes.  ENT: Denies discharge, congestion, post nasal drip, epistaxis, sore throat, earache, hearing loss, dental pain, Tinnitus, Vertigo, Sinus pain, snoring.  Cardio: Denies chest pain, palpitations,  irregular heartbeat, syncope, dyspnea, diaphoresis, orthopnea, PND, claudication, edema Respiratory: denies cough, dyspnea, DOE, pleurisy, hoarseness, laryngitis, wheezing.  Gastrointestinal: Denies dysphagia, heartburn, reflux, water brash, pain, cramps, nausea, vomiting, bloating, diarrhea, constipation, hematemesis, melena, hematochezia, jaundice, hemorrhoids Genitourinary: Denies dysuria, frequency, urgency, nocturia, hesitancy, discharge, hematuria, flank pain Breast:Breast lumps, nipple discharge, bleeding.  Musculoskeletal: Denies arthralgia, myalgia, stiffness, Jt. Swelling, pain, limp, and  strain/sprain. Skin: Denies puritis, rash, hives, warts, acne, eczema, changing in skin lesion Neuro: No weakness, tremor, incoordination, spasms, paresthesia, pain Psychiatric: Denies confusion, memory loss, sensory loss Endocrine: Denies change in weight, skin, hair change, nocturia, and paresthesia, diabetic polys, visual blurring, hyper / hypo glycemic episodes.  Heme/Lymph: No excessive bleeding, bruising, enlarged lymph nodes.   Physical Exam   BP 136/70  Pulse 80  Temp 98.1 F   Resp 16  Ht 5' 0.75" 1.543 m)  Wt 113 lb   BMI 21.53 kg/m2  General Appearance: Well nourished, in no apparent distress. Eyes: PERRLA, EOMs, conjunctiva no swelling or erythema, normal fundi and vessels. Sinuses: No frontal/maxillary tenderness ENT/Mouth: EACs patent / TMs  nl. Nares clear without erythema, swelling, mucoid exudates. Oral hygiene is good. No erythema, swelling, or exudate. Tongue normal, non-obstructing. Tonsils not swollen or erythematous. Hearing normal.  Neck: Supple, thyroid normal. No bruits, nodes or JVD. Respiratory: Respiratory effort normal.  BS equal and clear bilateral without rales, rhonci, wheezing or stridor. Cardio: Heart sounds are normal with regular rate and rhythm and no murmurs, rubs or gallops. Peripheral pulses are normal and equal bilaterally without edema. No aortic or femoral bruits. Chest: symmetric with normal excursions and percussion. Breasts: Symmetric, without lumps, nipple discharge, retractions, or fibrocystic changes.  Abdomen: Flat, soft, with bowl sounds. Nontender, no guarding, rebound, hernias, masses, or organomegaly.  Lymphatics: Non tender without lymphadenopathy.  Musculoskeletal: Full ROM all peripheral extremities, joint stability, 5/5 strength, and normal gait. Skin: Warm and dry without rashes, lesions, cyanosis, clubbing or  ecchymosis.  Neuro: Cranial nerves intact, reflexes equal bilaterally. Normal muscle tone, no cerebellar symptoms.  Sensation intact.  Pysch: Awake and oriented X 3, normal affect, Insight and Judgment appropriate.   Assessment and Plan  1. Annual Screening Examination 2. Hypertension  3. Hyperlipidemia 4. Pre Diabetes 5. Vitamin D Deficiency 6. DJD 7. Hypothyroidism  Continue prudent diet as discussed, weight control, BP monitoring, regular exercise, and medications. Discussed med's effects and SE's. Screening labs and tests as requested with regular follow-up as recommended.

## 2013-12-09 LAB — HEPATIC FUNCTION PANEL
ALT: 16 U/L (ref 0–35)
AST: 29 U/L (ref 0–37)
Albumin: 3.9 g/dL (ref 3.5–5.2)
Alkaline Phosphatase: 71 U/L (ref 39–117)
BILIRUBIN DIRECT: 0.1 mg/dL (ref 0.0–0.3)
BILIRUBIN INDIRECT: 0.4 mg/dL (ref 0.2–1.2)
BILIRUBIN TOTAL: 0.5 mg/dL (ref 0.2–1.2)
Total Protein: 7.1 g/dL (ref 6.0–8.3)

## 2013-12-09 LAB — BASIC METABOLIC PANEL WITH GFR
BUN: 26 mg/dL — AB (ref 6–23)
CHLORIDE: 95 meq/L — AB (ref 96–112)
CO2: 31 mEq/L (ref 19–32)
Calcium: 10.2 mg/dL (ref 8.4–10.5)
Creat: 1.19 mg/dL — ABNORMAL HIGH (ref 0.50–1.10)
GFR, EST AFRICAN AMERICAN: 47 mL/min — AB
GFR, Est Non African American: 41 mL/min — ABNORMAL LOW
Glucose, Bld: 78 mg/dL (ref 70–99)
POTASSIUM: 4.7 meq/L (ref 3.5–5.3)
SODIUM: 138 meq/L (ref 135–145)

## 2013-12-09 LAB — URINALYSIS, MICROSCOPIC ONLY
Bacteria, UA: NONE SEEN
CASTS: NONE SEEN
CRYSTALS: NONE SEEN
SQUAMOUS EPITHELIAL / LPF: NONE SEEN

## 2013-12-09 LAB — MICROALBUMIN / CREATININE URINE RATIO
CREATININE, URINE: 160 mg/dL
Microalb Creat Ratio: 7.5 mg/g (ref 0.0–30.0)
Microalb, Ur: 1.2 mg/dL (ref 0.00–1.89)

## 2013-12-09 LAB — LIPID PANEL
CHOL/HDL RATIO: 3.2 ratio
CHOLESTEROL: 220 mg/dL — AB (ref 0–200)
HDL: 69 mg/dL (ref >39)
LDL CALC: 130 mg/dL — AB (ref 0–99)
Triglycerides: 104 mg/dL (ref <150)
VLDL: 21 mg/dL (ref 0–40)

## 2013-12-09 LAB — HEMOGLOBIN A1C
Hgb A1c MFr Bld: 6 % — ABNORMAL HIGH (ref <5.7)
Mean Plasma Glucose: 126 mg/dL — ABNORMAL HIGH (ref <117)

## 2013-12-09 LAB — TSH: TSH: 7.782 u[IU]/mL — ABNORMAL HIGH (ref 0.350–4.500)

## 2013-12-09 LAB — INSULIN, FASTING: Insulin fasting, serum: 4 u[IU]/mL (ref 3–28)

## 2013-12-09 LAB — VITAMIN D 25 HYDROXY (VIT D DEFICIENCY, FRACTURES): Vit D, 25-Hydroxy: 77 ng/mL (ref 30–89)

## 2013-12-09 LAB — MAGNESIUM: Magnesium: 2.4 mg/dL (ref 1.5–2.5)

## 2013-12-20 ENCOUNTER — Other Ambulatory Visit: Payer: Self-pay | Admitting: Emergency Medicine

## 2013-12-22 ENCOUNTER — Other Ambulatory Visit (INDEPENDENT_AMBULATORY_CARE_PROVIDER_SITE_OTHER): Payer: Commercial Managed Care - HMO | Admitting: Physician Assistant

## 2013-12-22 DIAGNOSIS — Z1212 Encounter for screening for malignant neoplasm of rectum: Secondary | ICD-10-CM

## 2013-12-22 LAB — POC HEMOCCULT BLD/STL (HOME/3-CARD/SCREEN)
Card #2 Fecal Occult Blod, POC: NEGATIVE
FECAL OCCULT BLD: NEGATIVE
FECAL OCCULT BLD: NEGATIVE

## 2013-12-22 MED ORDER — ALPRAZOLAM 1 MG PO TABS: 1.0000 mg | ORAL_TABLET | Freq: Three times a day (TID) | ORAL | Status: DC | PRN

## 2013-12-22 MED ORDER — PREDNISONE 5 MG PO TABS: 5.0000 mg | ORAL_TABLET | Freq: Three times a day (TID) | ORAL | Status: DC | PRN

## 2014-01-22 ENCOUNTER — Telehealth: Payer: Self-pay

## 2014-01-22 NOTE — Telephone Encounter (Signed)
Pt called c/o severe abdominal pain w/ black stool. Per Dr. Oneta RackMcKeown, pt is advised to go to ER. Pt aware

## 2014-02-03 ENCOUNTER — Telehealth: Payer: Self-pay

## 2014-02-03 NOTE — Telephone Encounter (Signed)
[facility] called and said pt's recent visit to audiologist was denied. lmom to [employer] to return my call.

## 2014-03-12 ENCOUNTER — Ambulatory Visit: Payer: Self-pay | Admitting: Physician Assistant

## 2014-03-23 ENCOUNTER — Other Ambulatory Visit: Payer: Self-pay | Admitting: Emergency Medicine

## 2014-03-23 ENCOUNTER — Encounter: Payer: Self-pay | Admitting: Physician Assistant

## 2014-03-23 ENCOUNTER — Other Ambulatory Visit: Payer: Self-pay | Admitting: Internal Medicine

## 2014-03-23 ENCOUNTER — Ambulatory Visit (INDEPENDENT_AMBULATORY_CARE_PROVIDER_SITE_OTHER): Payer: Commercial Managed Care - HMO | Admitting: Physician Assistant

## 2014-03-23 VITALS — BP 148/80 | HR 60 | Temp 98.1°F | Resp 16 | Wt 109.0 lb

## 2014-03-23 DIAGNOSIS — Z79899 Other long term (current) drug therapy: Secondary | ICD-10-CM

## 2014-03-23 DIAGNOSIS — E559 Vitamin D deficiency, unspecified: Secondary | ICD-10-CM

## 2014-03-23 DIAGNOSIS — E538 Deficiency of other specified B group vitamins: Secondary | ICD-10-CM

## 2014-03-23 DIAGNOSIS — R7309 Other abnormal glucose: Secondary | ICD-10-CM

## 2014-03-23 DIAGNOSIS — I10 Essential (primary) hypertension: Secondary | ICD-10-CM

## 2014-03-23 DIAGNOSIS — E782 Mixed hyperlipidemia: Secondary | ICD-10-CM

## 2014-03-23 DIAGNOSIS — E039 Hypothyroidism, unspecified: Secondary | ICD-10-CM

## 2014-03-23 LAB — HEPATIC FUNCTION PANEL
ALBUMIN: 4 g/dL (ref 3.5–5.2)
ALK PHOS: 84 U/L (ref 39–117)
ALT: 23 U/L (ref 0–35)
AST: 27 U/L (ref 0–37)
Bilirubin, Direct: 0.1 mg/dL (ref 0.0–0.3)
Indirect Bilirubin: 0.4 mg/dL (ref 0.2–1.2)
Total Bilirubin: 0.5 mg/dL (ref 0.2–1.2)
Total Protein: 6.8 g/dL (ref 6.0–8.3)

## 2014-03-23 LAB — CBC WITH DIFFERENTIAL/PLATELET
BASOS ABS: 0.1 10*3/uL (ref 0.0–0.1)
BASOS PCT: 1 % (ref 0–1)
Eosinophils Absolute: 0.1 10*3/uL (ref 0.0–0.7)
Eosinophils Relative: 1 % (ref 0–5)
HCT: 44.4 % (ref 36.0–46.0)
Hemoglobin: 14.6 g/dL (ref 12.0–15.0)
Lymphocytes Relative: 45 % (ref 12–46)
Lymphs Abs: 3.8 10*3/uL (ref 0.7–4.0)
MCH: 28.7 pg (ref 26.0–34.0)
MCHC: 32.9 g/dL (ref 30.0–36.0)
MCV: 87.4 fL (ref 78.0–100.0)
MONO ABS: 0.6 10*3/uL (ref 0.1–1.0)
Monocytes Relative: 7 % (ref 3–12)
NEUTROS ABS: 3.9 10*3/uL (ref 1.7–7.7)
NEUTROS PCT: 46 % (ref 43–77)
Platelets: 236 10*3/uL (ref 150–400)
RBC: 5.08 MIL/uL (ref 3.87–5.11)
RDW: 14.8 % (ref 11.5–15.5)
WBC: 8.4 10*3/uL (ref 4.0–10.5)

## 2014-03-23 LAB — BASIC METABOLIC PANEL WITH GFR
BUN: 26 mg/dL — AB (ref 6–23)
CALCIUM: 10 mg/dL (ref 8.4–10.5)
CO2: 32 mEq/L (ref 19–32)
Chloride: 98 mEq/L (ref 96–112)
Creat: 1.21 mg/dL — ABNORMAL HIGH (ref 0.50–1.10)
GFR, EST AFRICAN AMERICAN: 46 mL/min — AB
GFR, Est Non African American: 40 mL/min — ABNORMAL LOW
GLUCOSE: 78 mg/dL (ref 70–99)
Potassium: 4.7 mEq/L (ref 3.5–5.3)
SODIUM: 138 meq/L (ref 135–145)

## 2014-03-23 LAB — LIPID PANEL
CHOLESTEROL: 239 mg/dL — AB (ref 0–200)
HDL: 75 mg/dL (ref >39)
LDL Cholesterol: 118 mg/dL — ABNORMAL HIGH (ref 0–99)
Total CHOL/HDL Ratio: 3.2 Ratio
Triglycerides: 228 mg/dL — ABNORMAL HIGH (ref <150)
VLDL: 46 mg/dL — ABNORMAL HIGH (ref 0–40)

## 2014-03-23 LAB — MAGNESIUM: Magnesium: 2.4 mg/dL (ref 1.5–2.5)

## 2014-03-23 NOTE — Progress Notes (Signed)
Assessment and Plan:  Hypertension: Continue medication, monitor blood pressure at home. Continue DASH diet. Cholesterol: Continue diet and exercise. Check cholesterol.  Pre-diabetes-Continue diet and exercise. Check A1C Vitamin D Def- check level and continue medications.  Stomach upset- add probiotic and if worse call office Weight decreased- increase prednisone, increase protein, does not want a big work up, ? If depression  Continue diet and meds as discussed. Further disposition pending results of labs.  HPI 80 y.o. female  presents for 3 month follow up with hypertension, hyperlipidemia, prediabetes and vitamin D. Her blood pressure has been controlled at home, today their BP is BP: 148/80 mmHg She does not workout. She denies chest pain, shortness of breath, dizziness.  She is not on cholesterol medication and denies myalgias. Her cholesterol is at goal. The cholesterol last visit was:   Lab Results  Component Value Date   CHOL 220* 12/08/2013   HDL 69 12/08/2013   LDLCALC 130* 12/08/2013   TRIG 104 12/08/2013   CHOLHDL 3.2 12/08/2013   She has been working on diet and exercise for prediabetes, and denies nausea, paresthesia of the feet and polydipsia. Last A1C in the office was:  Lab Results  Component Value Date   HGBA1C 6.0* 12/08/2013   Patient is on Vitamin D supplement.   Lab Results  Component Value Date   VD25OH 86 12/08/2013     Patient has moved into a nursing home kind of far from town and she has been having a hard time getting into the office.  She states she has been losing weight, she does not have much of an appetite. She is only on 1 prednisone 5mg  a day. She has been having some SOB with CP.  Feels her vision is getting worse.  Has had some stomach cramping gas.  Current Medications:  Current Outpatient Prescriptions on File Prior to Visit  Medication Sig Dispense Refill  . Cholecalciferol (VITAMIN D-3) 5000 UNITS TABS Take by mouth.      . losartan (COZAAR)  25 MG tablet Take 1 tablet (25 mg total) by mouth daily. For Blood Pressure  90 tablet  99  . Magnesium Oxide (PHILLIPS PO) Take by mouth as needed.      . predniSONE (DELTASONE) 5 MG tablet Take 1 tablet (5 mg total) by mouth 3 (three) times daily as needed.  100 tablet  0   No current facility-administered medications on file prior to visit.   Medical History:  Past Medical History  Diagnosis Date  . Hypothyroid   . Diverticulitis   . Hypertension   . Hyperlipidemia   . Murmur   . GERD (gastroesophageal reflux disease)   . Endometriosis   . Pre-diabetes    Allergies:  Allergies  Allergen Reactions  . Celexa [Citalopram Hydrobromide] Nausea And Vomiting  . Levaquin [Levofloxacin In D5w] Other (See Comments)    Hearing loss  . Red Yeast Rice Extract [Cholestin] Nausea Only  . Zocor [Simvastatin] Other (See Comments)    Elevated LFT     Review of Systems: [X]  = See HPI Family history- Review and unchanged Social history- Review and unchanged Physical Exam: BP 148/80  Pulse 60  Temp(Src) 98.1 F (36.7 C)  Resp 16  Wt 109 lb (49.442 kg) Wt Readings from Last 3 Encounters:  03/23/14 109 lb (49.442 kg)  12/08/13 113 lb (51.256 kg)  09/08/13 114 lb (51.71 kg)   General Appearance: Well nourished, in no apparent distress. 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 No erythema, swelling, or exudate on post pharynx.  Tonsils not swollen or erythematous. Hearing decreased Neck: Supple, thyroid normal.  Respiratory: Respiratory effort normal, BS equal bilaterally without rales, rhonchi, wheezing or stridor.  Cardio: RRR with 3/6 murmur. Brisk peripheral pulses without edema.  Abdomen: Soft, + BS.  Non tender, no guarding, rebound, hernias, masses. Lymphatics: Non tender without lymphadenopathy.  Musculoskeletal: Full ROM,  5/5 strength, normal gait.  Skin: Warm, dry without rashes, lesions, ecchymosis.  Neuro: Cranial nerves intact. Normal muscle tone, no cerebellar symptoms. Sensation intact.  Psych: Awake and oriented X 3, normal affect, Insight and Judgment appropriate.    Quentin Mullingollier, Kenley Rettinger 3:22 PM

## 2014-03-24 ENCOUNTER — Other Ambulatory Visit: Payer: Self-pay | Admitting: Physician Assistant

## 2014-03-24 LAB — HEMOGLOBIN A1C
Hgb A1c MFr Bld: 5.9 % — ABNORMAL HIGH (ref <5.7)
MEAN PLASMA GLUCOSE: 123 mg/dL — AB (ref <117)

## 2014-03-24 LAB — VITAMIN B12: VITAMIN B 12: 613 pg/mL (ref 211–911)

## 2014-03-24 LAB — VITAMIN D 25 HYDROXY (VIT D DEFICIENCY, FRACTURES): Vit D, 25-Hydroxy: 59 ng/mL (ref 30–89)

## 2014-03-24 LAB — TSH: TSH: 3.897 u[IU]/mL (ref 0.350–4.500)

## 2014-03-24 MED ORDER — PREDNISONE 5 MG PO TABS: 5.0000 mg | ORAL_TABLET | Freq: Three times a day (TID) | ORAL | Status: DC | PRN

## 2014-05-25 ENCOUNTER — Ambulatory Visit: Payer: Self-pay | Admitting: Internal Medicine

## 2014-06-02 ENCOUNTER — Ambulatory Visit: Payer: Self-pay | Admitting: Internal Medicine

## 2014-06-16 ENCOUNTER — Ambulatory Visit: Payer: Self-pay | Admitting: Internal Medicine

## 2014-06-25 ENCOUNTER — Ambulatory Visit (INDEPENDENT_AMBULATORY_CARE_PROVIDER_SITE_OTHER): Payer: Commercial Managed Care - HMO | Admitting: Internal Medicine

## 2014-06-25 ENCOUNTER — Encounter: Payer: Self-pay | Admitting: Internal Medicine

## 2014-06-25 VITALS — BP 132/70 | HR 76 | Temp 98.2°F | Resp 16 | Ht 61.0 in | Wt 112.4 lb

## 2014-06-25 DIAGNOSIS — Z23 Encounter for immunization: Secondary | ICD-10-CM

## 2014-06-25 DIAGNOSIS — Z1389 Encounter for screening for other disorder: Secondary | ICD-10-CM

## 2014-06-25 DIAGNOSIS — E559 Vitamin D deficiency, unspecified: Secondary | ICD-10-CM

## 2014-06-25 DIAGNOSIS — Z79899 Other long term (current) drug therapy: Secondary | ICD-10-CM

## 2014-06-25 DIAGNOSIS — Z789 Other specified health status: Secondary | ICD-10-CM

## 2014-06-25 DIAGNOSIS — R7309 Other abnormal glucose: Secondary | ICD-10-CM

## 2014-06-25 DIAGNOSIS — R7303 Prediabetes: Secondary | ICD-10-CM

## 2014-06-25 DIAGNOSIS — R6889 Other general symptoms and signs: Secondary | ICD-10-CM

## 2014-06-25 DIAGNOSIS — Z0001 Encounter for general adult medical examination with abnormal findings: Secondary | ICD-10-CM

## 2014-06-25 DIAGNOSIS — I10 Essential (primary) hypertension: Secondary | ICD-10-CM

## 2014-06-25 DIAGNOSIS — Z1331 Encounter for screening for depression: Secondary | ICD-10-CM

## 2014-06-25 DIAGNOSIS — E782 Mixed hyperlipidemia: Secondary | ICD-10-CM

## 2014-06-25 DIAGNOSIS — Z9181 History of falling: Secondary | ICD-10-CM

## 2014-06-25 LAB — CBC WITH DIFFERENTIAL/PLATELET
BASOS ABS: 0 10*3/uL (ref 0.0–0.1)
BASOS PCT: 0 % (ref 0–1)
EOS ABS: 0 10*3/uL (ref 0.0–0.7)
EOS PCT: 0 % (ref 0–5)
HEMATOCRIT: 43.6 % (ref 36.0–46.0)
Hemoglobin: 14.3 g/dL (ref 12.0–15.0)
Lymphocytes Relative: 12 % (ref 12–46)
Lymphs Abs: 0.7 10*3/uL (ref 0.7–4.0)
MCH: 30.2 pg (ref 26.0–34.0)
MCHC: 32.8 g/dL (ref 30.0–36.0)
MCV: 92 fL (ref 78.0–100.0)
MONO ABS: 0.1 10*3/uL (ref 0.1–1.0)
MONOS PCT: 1 % — AB (ref 3–12)
NEUTROS ABS: 5.4 10*3/uL (ref 1.7–7.7)
Neutrophils Relative %: 87 % — ABNORMAL HIGH (ref 43–77)
Platelets: 212 10*3/uL (ref 150–400)
RBC: 4.74 MIL/uL (ref 3.87–5.11)
RDW: 14.6 % (ref 11.5–15.5)
WBC: 6.2 10*3/uL (ref 4.0–10.5)

## 2014-06-25 MED ORDER — TRAMADOL HCL 50 MG PO TABS: ORAL_TABLET | ORAL | Status: AC

## 2014-06-25 MED ORDER — LEVOTHYROXINE SODIUM 75 MCG PO TABS: ORAL_TABLET | ORAL | Status: DC

## 2014-06-25 MED ORDER — ALPRAZOLAM 1 MG PO TABS: ORAL_TABLET | ORAL | Status: DC

## 2014-06-25 MED ORDER — PREDNISONE 5 MG PO TABS: ORAL_TABLET | ORAL | Status: DC

## 2014-06-25 NOTE — Patient Instructions (Signed)

## 2014-06-25 NOTE — Progress Notes (Signed)
Patient ID: Kristie [name], female   DOB: 1923-06-25, 80 y.o.   MRN: [id number]  [facility]  Assessment:   1. Essential hypertension  - TSH  2. Hyperlipidemia  - Lipid panel  3. Prediabetes  - Hemoglobin A1c - Insulin, fasting  4. Vitamin D deficiency  - Vit D  25 hydroxy (rtn osteoporosis monitoring)  5. Medication management  - CBC with Differential - BASIC METABOLIC PANEL WITH GFR - Hepatic function panel - Magnesium  6. Encounter for general adult medical examination with abnormal findings   7. Need for prophylactic vaccination and inoculation against influenza  - Flu vaccine HIGH DOSE PF (Fluzone Tri High dose)  8. At low risk for fall  - Neg Screening  9. Depression screen  - Negative  10. Osteoarthritis, Generalized / Chronic Pain Syndrome   - Manage with low dose steroids & maintenance with Tramadol  Plan:   During the course of the visit the patient was educated and counseled about appropriate screening and preventive services including:    Pneumococcal vaccine   Influenza vaccine  Td vaccine  Screening electrocardiogram  Bone densitometry screening  Colorectal cancer screening  Diabetes screening  Glaucoma screening  Nutrition counseling   Advanced directives: requested  Screening recommendations, referrals: Vaccinations: Tdap vaccine 12/04/2012 Influenza vaccine HD 06/25/14 Pneumococcal vaccine 12/04/12 Prevnar vaccine deferred til available Shingles vaccine 08/01/2007 Hep B vaccine not indicated  Nutrition assessed and recommended  Colonoscopy 2006/age deferred Recommended yearly ophthalmology/optometry visit for glaucoma screening and checkup Recommended yearly dental visit for hygiene and checkup Advanced directives - Yes  Conditions/risks identified: BMI: Discussed weight loss, diet, and increase physical activity.  Increase physical activity: AHA recommends 150 minutes of physical  activity a week.  Medications reviewed Diabetes is at goal, ACE/ARB therapy: Yes. Urinary Incontinence is not an issue: discussed non pharmacology and pharmacology options.  Fall risk: low- discussed PT, home fall assessment, medications.   Subjective:   Kristie BreathJacqueline G Langhorst is a 80 y.o. divorced white female who presents for Medicare Annual Wellness Visit and f/u OV.  Date of last medicare wellness visit is unknown.  She has had elevated blood pressure since 1997. Her blood pressure has been controlled at home, & today their BP is BP: 132/70 mmHg She does not workout. She denies chest pain, shortness of breath, dizziness.  She is not on cholesterol medication and denies myalgias. Her cholesterol is not at goal. The cholesterol last visit was:   Lab Results  Component Value Date   CHOL 239* 03/23/2014   HDL 75 03/23/2014   TRIG 228* 03/23/2014   CHOLHDL 3.2 03/23/2014  NonTreatment of Lipids is Age deferred.  She has had diabetes for 5 years (2010). She has been working on diet and exercise for diabetes, and denies foot ulcerations, hyperglycemia, paresthesia of the feet, polydipsia, polyuria and visual disturbances. Last A1C in the office was:  Lab Results  Component Value Date   HGBA1C 5.9* 03/23/2014   Patient is on Vitamin D supplement.   Lab Results  Component Value Date   VD25OH 59 03/23/2014      Names of Other Physician/Practitioners you currently use: 1. [facility] here for primary care 2. Dr Marvis Repressraig Greven, eye doctor, last visit Sept 2015 3. Dr Ronalee RedHartsell - [place], dentist, last visit 2015  Patient Care Team: Lucky CowboyWilliam Jese Comella, MD as PCP - General (Internal Medicine) Marvis Repressraig Greven, MD as Referring Physician (Ophthalmology) Lewayne BuntingBrian S Crenshaw, MD as  Consulting Physician (Cardiology)  Medication Review: Current Outpatient Prescriptions on File Prior to Visit  Medication Sig Dispense Refill  . Cholecalciferol (VITAMIN D-3) 5000 UNITS TABS  Take by mouth.      . losartan (COZAAR) 25 MG tablet Take 1 tablet (25 mg total) by mouth daily. For Blood Pressure  90 tablet  99   No current facility-administered medications on file prior to visit.    Current Problems (verified) Patient Active Problem List   Diagnosis Date Noted  . Vitamin D deficiency 12/08/2013  . DJD 12/08/2013  . Prediabetes 12/08/2013  . Medication management 12/08/2013  . Hyperlipidemia 09/10/2013  . Hypothyroidism   . Diverticulitis   . Hypertension     Screening Tests Health Maintenance  Topic Date Due  . Colonoscopy  03/22/1975  . Influenza Vaccine  04/11/2014  . Tetanus/tdap  12/05/2022  . Pneumococcal Polysaccharide Vaccine Age 78 And Over  Completed  . Zostavax  Completed    Immunization History  Administered Date(s) Administered  . Influenza Whole 07/10/2013  . Influenza, High Dose Seasonal PF 06/25/2014  . Pneumococcal Polysaccharide-23 12/04/2012  . Tdap 12/04/2012  . Zoster 08/01/2007    Preventative care: Last colonoscopy: 2006/ Age Deferred  Prior vaccinations: TD or Tdap: 12/04/2012  Influenza: HD 06/25/2014  Pneumococcal: 12/04/2012 Prevnar: deferred due to availability Shingles/Zostavax: 08/01/2007  History reviewed: allergies, current medications, past family history, past medical history, past social history, past surgical history and problem list   Risk Factors: Tobacco History  Substance Use Topics  . Smoking status: Never Smoker   . Smokeless tobacco: Not on file  . Alcohol Use: No   She does not smoke.  Patient is not a former smoker. Are there smokers in your home (other than you)?  No  Alcohol Current alcohol use: none  Caffeine Current caffeine use: denies use  Exercise Current exercise: none  Nutrition/Diet Current diet: in general, a "healthy" diet    Cardiac risk factors: advanced age (older than 5855 for men, 6165 for women), diabetes mellitus, dyslipidemia, hypertension and sedentary  lifestyle.  Depression Screen (Note: if answer to either of the following is "Yes", a more complete depression screening is indicated)   Q1: Over the past two weeks, have you felt down, depressed or hopeless? No  Q2: Over the past two weeks, have you felt little interest or pleasure in doing things? No  Have you lost interest or pleasure in daily life? No  Do you often feel hopeless? No  Do you cry easily over simple problems? No  Activities of Daily Living In your present state of health, do you have any difficulty performing the following activities?:  Driving? No Managing money?  No Feeding yourself? No Getting from bed to chair? No Climbing a flight of stairs? No Preparing food and eating?: No Bathing or showering? No Getting dressed: No Getting to the toilet? No Using the toilet:No Moving around from place to place: No In the past year have you fallen or had a near fall?:No   Are you sexually active?  No  Do you have more than one partner?  No  Vision Difficulties: No  Hearing Difficulties: No Do you often ask people to speak up or repeat themselves? No Do you experience ringing or noises in your ears? No Do you have difficulty understanding soft or whispered voices? Sometimes.  Cognition  Do you feel that you have a problem with memory?No  Do you often misplace items? No  Do you feel safe at  home?  Yes  Advanced directives Does patient have a Health Care Power of Attorney? Yes Does patient have a Living Will? Yes   Objective:     Blood pressure 132/70, pulse 76, temperature 98.2 F (36.8 C), temperature source Temporal, resp. rate 16, height 5\' 1"  (1.549 m), weight 112 lb 6.4 oz (50.984 kg). Body mass index is 21.25 kg/(m^2).  General appearance: alert, no distress, WD/WN, female Cognitive Testing  Alert? Yes  Normal Appearance?Yes  Oriented to person? Yes  Place? Yes   Time? Yes  Recall of three objects?  Yes  Can perform simple calculations?  Yes  Displays appropriate judgment? Yes  Can read the correct time from a watch/clock?Yes  HEENT: normocephalic, sclerae anicteric, TMs pearly, nares patent, no discharge or erythema, pharynx normal Oral cavity: MMM, no lesions Neck: supple, no lymphadenopathy, no thyromegaly, no masses Heart: RRR, normal S1, S2, no murmurs Lungs: CTA bilaterally, no wheezes, rhonchi, or rales Breasts: No masses. Abdomen: +bs, soft, non tender, non distended, no masses, no hepatomegaly, no splenomegaly Musculoskeletal: nontender, no swelling, no obvious deformity. Generalized decrease in muscle power, tone and bulk.  Extremities: no edema, no cyanosis, no clubbing Pulses: 2+ symmetric, upper and lower extremities, normal cap refill Neurological: alert, oriented x 3, CN2-12 intact, strength normal upper extremities and lower extremities, sensation normal throughout, DTRs 2+ throughout, no cerebellar signs, gait normal Psychiatric: normal affect, behavior normal, pleasant   Medicare Attestation I have personally reviewed: The patient's medical and social history Their use of alcohol, tobacco or illicit drugs Their current medications and supplements The patient's functional ability including ADLs,fall risks, home safety risks, cognitive, and hearing and visual impairment Diet and physical activities Evidence for depression or mood disorders  The patient's weight, height, BMI, and visual acuity have been recorded in the chart.  I have made referrals, counseling, and provided education to the patient based on review of the above and I have provided the patient with a written personalized care plan for preventive services.    Lusine Corlett DAVID, MD   06/25/2014

## 2014-06-26 LAB — HEPATIC FUNCTION PANEL
ALBUMIN: 3.9 g/dL (ref 3.5–5.2)
ALT: 24 U/L (ref 0–35)
AST: 33 U/L (ref 0–37)
Alkaline Phosphatase: 92 U/L (ref 39–117)
Bilirubin, Direct: 0.1 mg/dL (ref 0.0–0.3)
Indirect Bilirubin: 0.4 mg/dL (ref 0.2–1.2)
Total Bilirubin: 0.5 mg/dL (ref 0.2–1.2)
Total Protein: 6.8 g/dL (ref 6.0–8.3)

## 2014-06-26 LAB — BASIC METABOLIC PANEL WITH GFR
BUN: 22 mg/dL (ref 6–23)
CALCIUM: 9.7 mg/dL (ref 8.4–10.5)
CO2: 27 mEq/L (ref 19–32)
CREATININE: 1.03 mg/dL (ref 0.50–1.10)
Chloride: 101 mEq/L (ref 96–112)
GFR, EST AFRICAN AMERICAN: 56 mL/min — AB
GFR, EST NON AFRICAN AMERICAN: 48 mL/min — AB
GLUCOSE: 176 mg/dL — AB (ref 70–99)
Potassium: 4.7 mEq/L (ref 3.5–5.3)
Sodium: 137 mEq/L (ref 135–145)

## 2014-06-26 LAB — LIPID PANEL
CHOLESTEROL: 232 mg/dL — AB (ref 0–200)
HDL: 81 mg/dL (ref >39)
LDL Cholesterol: 123 mg/dL — ABNORMAL HIGH (ref 0–99)
Total CHOL/HDL Ratio: 2.9 Ratio
Triglycerides: 138 mg/dL (ref <150)
VLDL: 28 mg/dL (ref 0–40)

## 2014-06-26 LAB — MAGNESIUM: Magnesium: 2 mg/dL (ref 1.5–2.5)

## 2014-06-26 LAB — VITAMIN D 25 HYDROXY (VIT D DEFICIENCY, FRACTURES): Vit D, 25-Hydroxy: 51 ng/mL (ref 30–89)

## 2014-06-26 LAB — TSH: TSH: 2.512 u[IU]/mL (ref 0.350–4.500)

## 2014-06-26 LAB — HEMOGLOBIN A1C
HEMOGLOBIN A1C: 6.2 % — AB (ref <5.7)
MEAN PLASMA GLUCOSE: 131 mg/dL — AB (ref <117)

## 2014-06-26 LAB — INSULIN, FASTING: INSULIN FASTING, SERUM: 27.5 u[IU]/mL — AB (ref 2.0–19.6)

## 2014-09-24 ENCOUNTER — Ambulatory Visit: Payer: Self-pay | Admitting: Physician Assistant

## 2014-10-06 ENCOUNTER — Ambulatory Visit: Payer: Self-pay | Admitting: Physician Assistant

## 2014-12-09 ENCOUNTER — Encounter: Payer: Self-pay | Admitting: Internal Medicine

## 2014-12-15 ENCOUNTER — Encounter: Payer: Self-pay | Admitting: Internal Medicine

## 2014-12-25 ENCOUNTER — Ambulatory Visit (INDEPENDENT_AMBULATORY_CARE_PROVIDER_SITE_OTHER): Payer: Medicare Other | Admitting: Internal Medicine

## 2014-12-25 ENCOUNTER — Encounter: Payer: Self-pay | Admitting: Internal Medicine

## 2014-12-25 VITALS — BP 170/82 | HR 77 | Temp 97.4°F | Resp 18 | Ht 61.0 in | Wt 113.4 lb

## 2014-12-25 DIAGNOSIS — M2550 Pain in unspecified joint: Secondary | ICD-10-CM | POA: Diagnosis not present

## 2014-12-25 DIAGNOSIS — K5900 Constipation, unspecified: Secondary | ICD-10-CM | POA: Diagnosis not present

## 2014-12-25 DIAGNOSIS — R5383 Other fatigue: Secondary | ICD-10-CM | POA: Diagnosis not present

## 2014-12-25 DIAGNOSIS — R531 Weakness: Secondary | ICD-10-CM | POA: Diagnosis not present

## 2014-12-25 DIAGNOSIS — E038 Other specified hypothyroidism: Secondary | ICD-10-CM | POA: Diagnosis not present

## 2014-12-25 DIAGNOSIS — R010 Benign and innocent cardiac murmurs: Secondary | ICD-10-CM | POA: Diagnosis not present

## 2014-12-25 DIAGNOSIS — F329 Major depressive disorder, single episode, unspecified: Secondary | ICD-10-CM | POA: Diagnosis not present

## 2014-12-25 DIAGNOSIS — E034 Atrophy of thyroid (acquired): Secondary | ICD-10-CM

## 2014-12-25 DIAGNOSIS — F32A Depression, unspecified: Secondary | ICD-10-CM | POA: Insufficient documentation

## 2014-12-25 DIAGNOSIS — R7309 Other abnormal glucose: Secondary | ICD-10-CM

## 2014-12-25 DIAGNOSIS — R011 Cardiac murmur, unspecified: Secondary | ICD-10-CM

## 2014-12-25 DIAGNOSIS — E0789 Other specified disorders of thyroid: Secondary | ICD-10-CM | POA: Diagnosis not present

## 2014-12-25 DIAGNOSIS — R7303 Prediabetes: Secondary | ICD-10-CM

## 2014-12-25 MED ORDER — LEVOTHYROXINE SODIUM 75 MCG PO TABS: ORAL_TABLET | ORAL | Status: DC

## 2014-12-25 MED ORDER — ALPRAZOLAM 1 MG PO TABS: ORAL_TABLET | ORAL | Status: DC

## 2014-12-25 MED ORDER — LINACLOTIDE 145 MCG PO CAPS: 145.0000 ug | ORAL_CAPSULE | Freq: Every day | ORAL | Status: DC

## 2014-12-25 NOTE — Patient Instructions (Signed)
Continue current medications as ordered.  Will call with lab results and Cardiology appt  Follow up in 1 month

## 2014-12-25 NOTE — Progress Notes (Signed)
Patient ID: Kristie [name], female   DOB: 31-Jan-1923, 81 y.o.   MRN: [id number]    Facility  [facility]    Place of Service:   OFFICE    Allergies  Allergen Reactions  . Celexa [Citalopram Hydrobromide] Nausea And Vomiting  . Levaquin [Levofloxacin In D5w] Other (See Comments)    Hearing loss  . Red Yeast Rice Extract [Cholestin] Nausea Only  . Zocor [Simvastatin] Other (See Comments)    Elevated LFT    Chief Complaint  Patient presents with  . Establish Care    HPI:  81 yo female sen today as a new pt. She is c/a back pain and has a hx spinal stenosis. Takes prn tramadol. She had an epidural injection at least 2 years ago into her midback. She has numbness/tingling in her hands but not in her feet. Hx lumbar spine sx.  She has severe constipation and actually req'd disimpaction last August. Tried miralax but developed facial rash and stopped taking it. Currently taking MOM which helps occasionally. At times, she goes to bathroom and does not realize have needed to have one. She has a hx diverticulosis  She gets weak when she has palpitations. She has not had her thyroid checked in a while. She does not take losartan on a regular basis. BP elevated when anxious.  Her family is not very supportive. She lives in independent living at [facility] in [place]. She uses a cane to ambulate. She does not interact with other residents as she states they are "not friendly". She denies feeling depressed but does feel sad as she is not happy there. She has taken SSRIs in the past but was intolerant to them.  Past Medical History  Diagnosis Date  . Hypothyroid   . Diverticulitis   . Hypertension   . Hyperlipidemia   . Murmur   . GERD (gastroesophageal reflux disease)   . Endometriosis   . Pre-diabetes   . Macular degeneration   . High cholesterol   . Thyroid activity decreased   . Hearing loss   . Spinal stenosis     Dr. Berenice Primas  . Arthritis    Past Surgical History    Procedure Laterality Date  . Abdominal hysterectomy    . Appendectomy    . Back surgery    . Tonsillectomy    . Ankle surgery    . Knee surgery    . Tumor removal     History   Social History  . Marital Status: Widowed    Spouse Name: N/A  . Number of Children: 3  . Years of Education: N/A   Social History Main Topics  . Smoking status: Never Smoker   . Smokeless tobacco: Never Used  . Alcohol Use: No  . Drug Use: No  . Sexual Activity: Not on file   Other Topics Concern  . None   Social History Narrative   Diet: Fruit , Vegtables., Meat       Do you drink/ eat things with caffeine? 1 cup of coffee,tea      Marital status: Widow                               What year were you married ?1945      Do you live in a house, apartment,assistred living, condo, trailer, etc.)? Apartment Senior Living  (independent)      Is it one or more stories? 3  How many persons live in your home ? 1      Do you have any pets in your home ?(please list)  Yes (small dog)      Current or past profession: Network engineer, Receptionist       Do you exercise?  Not much                            Type & how often: Walk with apt. Manger a little      Do you have a living will?  Yes      Do you have a DNR form?    Yes                    If not, do you want to discuss one?       Do you have signed POA?HPOA forms? Yes                If so, please bring to your        appointment         Family History  Problem Relation Age of Onset  . Heart disease Sister     CHF in her 31s  . Heart disease Mother   . Diabetes Father   . Heart disease Father   . Gout Son   . Panic disorder Daughter   . Arthritis Mother   . Osteoporosis Sister      Medications: Patient's Medications  New Prescriptions   No medications on file  Previous Medications   ALPRAZOLAM (XANAX) 1 MG TABLET    Take 1/2 to 1 tablet 2 or 3 x daily as needed for Anxiety or Sleep   CHOLECALCIFEROL (VITAMIN D-3) 5000  UNITS TABS    Take by mouth.   LEVOTHYROXINE (SYNTHROID, LEVOTHROID) 75 MCG TABLET    Take 1 tablet daily 30 minutes before breakfast   LOSARTAN (COZAAR) 25 MG TABLET    Take 1 tablet (25 mg total) by mouth daily. For Blood Pressure   MAGNESIUM HYDROXIDE (MILK OF MAGNESIA) 400 MG/5ML SUSPENSION    Take 5 mLs by mouth daily as needed for mild constipation.   MULTIPLE VITAMIN (MULTIVITAMIN) TABLET    Take 1 tablet by mouth daily.   TRAMADOL (ULTRAM) 50 MG TABLET    Take 1 tabley 3 or 4 x daily as needed for pain  Modified Medications   No medications on file  Discontinued Medications   POLYETHYLENE GLYCOL 3350 (MIRALAX PO)    Take by mouth. Patient takes 1 capful daily   PREDNISONE (DELTASONE) 5 MG TABLET    Ta1 tablet up to 3 x daily as directed     Review of Systems  Constitutional: Positive for diaphoresis and fatigue. Negative for fever, chills, activity change and appetite change.  HENT: Positive for hearing loss, rhinorrhea and tinnitus. Negative for ear pain and sore throat.        Dry mouth  Eyes: Positive for visual disturbance (reduced vivsion due to macular degeneration).  Respiratory: Positive for shortness of breath. Negative for cough and chest tightness.   Cardiovascular: Positive for palpitations. Negative for chest pain and leg swelling.  Gastrointestinal: Positive for nausea, abdominal pain and constipation. Negative for vomiting, diarrhea and blood in stool.  Endocrine: Positive for heat intolerance.  Genitourinary: Positive for decreased urine volume. Negative for dysuria.  Musculoskeletal: Positive for myalgias, back pain, joint swelling and arthralgias.  Skin:  dry  Neurological: Positive for weakness. Negative for dizziness, tremors, numbness and headaches.  Hematological: Bruises/bleeds easily.  Psychiatric/Behavioral: Positive for sleep disturbance. Negative for suicidal ideas. The patient is nervous/anxious.     Filed Vitals:   12/25/14 1358  BP: 170/82    Pulse: 77  Temp: 97.4 F (36.3 C)  TempSrc: Oral  Resp: 18  Height: _0  (1.549 m)  Weight: 113 lb 6.4 oz (51.438 kg)  SpO2: 98%   Body mass index is 21.44 kg/(m^2).  Physical Exam  Constitutional: She is oriented to person, place, and time. She appears well-developed and well-nourished. No distress.  Looks frail in NAD. Awake and alert. Family friend present  HENT:  Mouth/Throat: Oropharynx is clear and moist. No oropharyngeal exudate.  Eyes: Pupils are equal, round, and reactive to light. No scleral icterus.  Neck: Neck supple. No tracheal deviation present. No thyromegaly present.  Cardiovascular: Normal rate, regular rhythm and intact distal pulses.  Exam reveals no gallop, no distant heart sounds and no friction rub.   Murmur (holosystolic) heard.  Systolic murmur is present with a grade of 2/6  No LE edema. No calf swelling. Distal pulses palpable  Pulmonary/Chest: Effort normal and breath sounds normal. No stridor. No respiratory distress. She has no wheezes. She has no rales.  Abdominal: Soft. Bowel sounds are normal. She exhibits distension. She exhibits no mass. There is tenderness. There is no rebound and no guarding.  Musculoskeletal: She exhibits edema.  Left ankle swelling with reduced ROM  Lymphadenopathy:    She has no cervical adenopathy.  Neurological: She is alert and oriented to person, place, and time. She has normal reflexes.  Skin: Skin is warm and dry. No rash noted.  Psychiatric: Her speech is normal and behavior is normal. Judgment and thought content normal. Cognition and memory are normal. She exhibits a depressed mood (crying).     Labs reviewed: No visits with results within 3 Month(s) from this visit. Latest known visit with results is:  Office Visit on 06/25/2014  Component Date Value Ref Range Status  . WBC 06/25/2014 6.2  4.0 - 10.5 K/uL Final  . RBC 06/25/2014 4.74  3.87 - 5.11 MIL/uL Final  . Hemoglobin 06/25/2014 14.3  12.0 - 15.0 g/dL  Final  . HCT 06/25/2014 43.6  36.0 - 46.0 % Final  . MCV 06/25/2014 92.0  78.0 - 100.0 fL Final  . MCH 06/25/2014 30.2  26.0 - 34.0 pg Final  . MCHC 06/25/2014 32.8  30.0 - 36.0 g/dL Final  . RDW 06/25/2014 14.6  11.5 - 15.5 % Final  . Platelets 06/25/2014 212  150 - 400 K/uL Final  . Neutrophils Relative % 06/25/2014 87* 43 - 77 % Final  . Neutro Abs 06/25/2014 5.4  1.7 - 7.7 K/uL Final  . Lymphocytes Relative 06/25/2014 12  12 - 46 % Final  . Lymphs Abs 06/25/2014 0.7  0.7 - 4.0 K/uL Final  . Monocytes Relative 06/25/2014 1* 3 - 12 % Final  . Monocytes Absolute 06/25/2014 0.1  0.1 - 1.0 K/uL Final  . Eosinophils Relative 06/25/2014 0  0 - 5 % Final  . Eosinophils Absolute 06/25/2014 0.0  0.0 - 0.7 K/uL Final  . Basophils Relative 06/25/2014 0  0 - 1 % Final  . Basophils Absolute 06/25/2014 0.0  0.0 - 0.1 K/uL Final  . Smear Review 06/25/2014 Criteria for review not met   Final  . Sodium 06/25/2014 137  135 - 145 mEq/L Final  . Potassium  06/25/2014 4.7  3.5 - 5.3 mEq/L Final  . Chloride 06/25/2014 101  96 - 112 mEq/L Final  . CO2 06/25/2014 27  19 - 32 mEq/L Final  . Glucose, Bld 06/25/2014 176* 70 - 99 mg/dL Final  . BUN 06/25/2014 22  6 - 23 mg/dL Final  . Creat 06/25/2014 1.03  0.50 - 1.10 mg/dL Final  . Calcium 06/25/2014 9.7  8.4 - 10.5 mg/dL Final  . GFR, Est African American 06/25/2014 56*  Final  . GFR, Est Non African American 06/25/2014 48*  Final   Comment:                            The estimated GFR is a calculation valid for adults (>=91 years old)                          that uses the CKD-EPI algorithm to adjust for age and sex. It is                            not to be used for children, pregnant women, hospitalized patients,                             patients on dialysis, or with rapidly changing kidney function.                          According to the NKDEP, eGFR >89 is normal, 60-89 shows mild                          impairment, 30-59 shows moderate  impairment, 15-29 shows severe                          impairment and <15 is ESRD.                             Marland Kitchen Total Bilirubin 06/25/2014 0.5  0.2 - 1.2 mg/dL Final  . Bilirubin, Direct 06/25/2014 0.1  0.0 - 0.3 mg/dL Final  . Indirect Bilirubin 06/25/2014 0.4  0.2 - 1.2 mg/dL Final  . Alkaline Phosphatase 06/25/2014 92  39 - 117 U/L Final  . AST 06/25/2014 33  0 - 37 U/L Final  . ALT 06/25/2014 24  0 - 35 U/L Final  . Total Protein 06/25/2014 6.8  6.0 - 8.3 g/dL Final  . Albumin 06/25/2014 3.9  3.5 - 5.2 g/dL Final  . Magnesium 06/25/2014 2.0  1.5 - 2.5 mg/dL Final  . Cholesterol 06/25/2014 232* 0 - 200 mg/dL Final   Comment: ATP III Classification:                                < 200        mg/dL        Desirable                               200 - 239     mg/dL        Borderline High                               >=  240        mg/dL        High                             . Triglycerides 06/25/2014 138  <150 mg/dL Final  . HDL 06/25/2014 81  >39 mg/dL Final  . Total CHOL/HDL Ratio 06/25/2014 2.9   Final  . VLDL 06/25/2014 28  0 - 40 mg/dL Final  . LDL Cholesterol 06/25/2014 123* 0 - 99 mg/dL Final   Comment:                            Total Cholesterol/HDL Ratio:CHD Risk                                                 Coronary Heart Disease Risk Table                                                                 Men       Women                                   1/2 Average Risk              3.4        3.3                                       Average Risk              5.0        4.4                                    2X Average Risk              9.6        7.1                                    3X Average Risk             23.4       11.0                          Use the calculated Patient Ratio above and the CHD Risk table                           to determine the patient's CHD Risk.                          ATP III Classification (LDL):                                <  100        mg/dL         Optimal                               100 - 129     mg/dL         Near or Above Optimal                               130 - 159     mg/dL         Borderline High                               160 - 189     mg/dL         High                                > 190        mg/dL         Very High                             . TSH 06/25/2014 2.512  0.350 - 4.500 uIU/mL Final  . Hgb A1c MFr Bld 06/25/2014 6.2* <5.7 % Final   Comment:                                                                                                 According to the ADA Clinical Practice Recommendations for 2011, when                          HbA1c is used as a screening test:                                                       >=6.5%   Diagnostic of Diabetes Mellitus                                     (if abnormal result is confirmed)                                                     5.7-6.4%   Increased risk of developing Diabetes Mellitus  References:Diagnosis and Classification of Diabetes Mellitus,Diabetes                          IEPP,2951,88(CZYSA 1):S62-S69 and Standards of Medical Care in                                  Diabetes - 2011,Diabetes YTKZ,6010,93 (Suppl 1):S11-S61.                             . Mean Plasma Glucose 06/25/2014 131* <117 mg/dL Final  . Insulin fasting, serum 06/25/2014 27.5* 2.0 - 19.6 uIU/mL Final   Comment: ** Please note change in reference range(s). **                                                     This insulin assay shows strong cross-reactivity for some insulin                          analogs (lispro, aspart, and glargine) and much lower cross-reactivity                          with others (detemir, glulisine).                                                     Stimulated Insulin reference intervals were established using the                          [employer] Immulite assay. These values  are provided for general guidance                          only.  . Vit D, 25-Hydroxy 06/25/2014 51  30 - 89 ng/mL Final   Comment: This assay accurately quantifies Vitamin D, which is the sum of the                          25-Hydroxy forms of Vitamin D2 and D3.  Studies have shown that the                          optimum concentration of 25-Hydroxy Vitamin D is 30 ng/mL or higher.                           Concentrations of Vitamin D between 20 and 29 ng/mL are considered to                          be insufficient and concentrations less than 20 ng/mL are considered                          to be deficient for Vitamin D.     Assessment/Plan   ICD-9-CM ICD-10-CM  1. Weakness probably due to #2 vs #4 780.79 R53.1 CBC with Differential     CMP     Ambulatory referral to Cardiology     CANCELED: Ambulatory referral to Cardiology  2. Undiagnosed cardiac murmurs 785.2 R01.0 Ambulatory referral to Cardiology     CANCELED: Ambulatory referral to Cardiology  3. Constipation, unspecified constipation type - uncontrolled with miralax 564.00 K59.00 Linaclotide (LINZESS) 145 MCG CAPS capsule  4. Hypothyroidism due to acquired atrophy of thyroid - possibly uncontrolled and causing #3; on levothyroxine 244.8 E03.8 levothyroxine (SYNTHROID, LEVOTHROID) 75 MCG tablet   246.8 E03.4 TSH     T4, Free  5. Prediabetes  790.29 R73.09 Hemoglobin A1c  6. Pain, joint, multiple sites - controlled on tramadol 719.49 M25.50   7. Depression with anxiety +/- adjustment d/o - uncontrolled 311 F32.9 ALPRAZolam (XANAX) 1 MG tablet    --refer to cardiology for w/u of murmur. Most likely the cause of her increasing weakness. Recommend she takes BP med daily but she insists to take it prn  --may need antidepressant but will r/o metabolic issue 1st  --check labs  --continue other medications as ordered.   --f/u in 1 month to reck sx's. Get old records  Kegan Mckeithan S. Perlie Gold  [facility] [address] [place], [place] [address] [phone] Office (Wednesdays and Fridays 8 AM - 5 PM) [phone] Cell (Monday-Friday 8 AM - 5 PM)

## 2014-12-26 LAB — CBC WITH DIFFERENTIAL/PLATELET
BASOS: 0 %
Basophils Absolute: 0 10*3/uL (ref 0.0–0.2)
EOS: 1 %
Eosinophils Absolute: 0.1 10*3/uL (ref 0.0–0.4)
HCT: 45 % (ref 34.0–46.6)
HEMOGLOBIN: 14.5 g/dL (ref 11.1–15.9)
IMMATURE GRANULOCYTES: 0 %
Immature Grans (Abs): 0 10*3/uL (ref 0.0–0.1)
LYMPHS ABS: 2.6 10*3/uL (ref 0.7–3.1)
Lymphs: 39 %
MCH: 29.1 pg (ref 26.6–33.0)
MCHC: 32.2 g/dL (ref 31.5–35.7)
MCV: 90 fL (ref 79–97)
MONOCYTES: 8 %
Monocytes Absolute: 0.6 10*3/uL (ref 0.1–0.9)
NEUTROS PCT: 52 %
Neutrophils Absolute: 3.4 10*3/uL (ref 1.4–7.0)
PLATELETS: 201 10*3/uL (ref 150–379)
RBC: 4.98 x10E6/uL (ref 3.77–5.28)
RDW: 13.5 % (ref 12.3–15.4)
WBC: 6.7 10*3/uL (ref 3.4–10.8)

## 2014-12-26 LAB — COMPREHENSIVE METABOLIC PANEL
ALT: 19 IU/L (ref 0–32)
AST: 36 IU/L (ref 0–40)
Albumin/Globulin Ratio: 1.6 (ref 1.1–2.5)
Albumin: 4.4 g/dL (ref 3.5–4.7)
Alkaline Phosphatase: 93 IU/L (ref 39–117)
BUN/Creatinine Ratio: 20 (ref 11–26)
BUN: 21 mg/dL (ref 8–27)
Bilirubin Total: 0.4 mg/dL (ref 0.0–1.2)
CALCIUM: 9.6 mg/dL (ref 8.7–10.3)
CO2: 27 mmol/L (ref 18–29)
Chloride: 97 mmol/L (ref 97–108)
Creatinine, Ser: 1.05 mg/dL — ABNORMAL HIGH (ref 0.57–1.00)
GFR calc non Af Amer: 47 mL/min/{1.73_m2} — ABNORMAL LOW (ref >59)
GFR, EST AFRICAN AMERICAN: 54 mL/min/{1.73_m2} — AB (ref >59)
GLOBULIN, TOTAL: 2.8 g/dL (ref 1.5–4.5)
Glucose: 121 mg/dL — ABNORMAL HIGH (ref 65–99)
Potassium: 4.1 mmol/L (ref 3.5–5.2)
Sodium: 138 mmol/L (ref 134–144)
Total Protein: 7.2 g/dL (ref 6.0–8.5)

## 2014-12-26 LAB — HEMOGLOBIN A1C
Est. average glucose Bld gHb Est-mCnc: 120 mg/dL
Hgb A1c MFr Bld: 5.8 % — ABNORMAL HIGH (ref 4.8–5.6)

## 2014-12-26 LAB — TSH: TSH: 11.55 u[IU]/mL — ABNORMAL HIGH (ref 0.450–4.500)

## 2014-12-26 LAB — T4, FREE: Free T4: 0.92 ng/dL (ref 0.82–1.77)

## 2014-12-28 ENCOUNTER — Encounter: Payer: Self-pay | Admitting: Internal Medicine

## 2014-12-29 DIAGNOSIS — E034 Atrophy of thyroid (acquired): Secondary | ICD-10-CM

## 2015-01-11 ENCOUNTER — Other Ambulatory Visit: Payer: Self-pay

## 2015-01-11 DIAGNOSIS — R1032 Left lower quadrant pain: Secondary | ICD-10-CM

## 2015-01-13 ENCOUNTER — Encounter: Payer: Self-pay | Admitting: Internal Medicine

## 2015-01-13 ENCOUNTER — Ambulatory Visit (INDEPENDENT_AMBULATORY_CARE_PROVIDER_SITE_OTHER): Payer: Medicare Other | Admitting: Internal Medicine

## 2015-01-13 VITALS — BP 112/68 | HR 70 | Temp 98.3°F | Resp 16 | Ht 61.0 in | Wt 110.0 lb

## 2015-01-13 DIAGNOSIS — I10 Essential (primary) hypertension: Secondary | ICD-10-CM

## 2015-01-13 DIAGNOSIS — K5900 Constipation, unspecified: Secondary | ICD-10-CM

## 2015-01-13 DIAGNOSIS — K219 Gastro-esophageal reflux disease without esophagitis: Secondary | ICD-10-CM | POA: Diagnosis not present

## 2015-01-13 DIAGNOSIS — R010 Benign and innocent cardiac murmurs: Secondary | ICD-10-CM

## 2015-01-13 DIAGNOSIS — L259 Unspecified contact dermatitis, unspecified cause: Secondary | ICD-10-CM | POA: Diagnosis not present

## 2015-01-13 DIAGNOSIS — R011 Cardiac murmur, unspecified: Secondary | ICD-10-CM

## 2015-01-13 DIAGNOSIS — M2550 Pain in unspecified joint: Secondary | ICD-10-CM | POA: Diagnosis not present

## 2015-01-13 MED ORDER — ESOMEPRAZOLE MAGNESIUM 40 MG PO CPDR: 40.0000 mg | DELAYED_RELEASE_CAPSULE | Freq: Every day | ORAL | Status: DC

## 2015-01-13 MED ORDER — TRAMADOL HCL 50 MG PO TABS: 50.0000 mg | ORAL_TABLET | Freq: Four times a day (QID) | ORAL | Status: DC | PRN

## 2015-01-13 NOTE — Patient Instructions (Addendum)
Try samples of linzess for constipation. Take 1 daily  Will call with GI referral appointment  Follow up with cardiology as scheduled  Continue current medications as ordered  Follow up as scheduled

## 2015-01-13 NOTE — Progress Notes (Addendum)
Patient ID: Kristie [name], female   DOB: Dec 04, 1922, 81 y.o.   MRN: [id number]    Location:    [facility]   Place of Service:   OFFICE   Chief Complaint  Patient presents with  . Acute Visit    Patient c/o extreme pain all over, patient questions if she has rheumatoid arthritis. Patient with left foot swelling and dizziness off/on.  Alton Revere. Bone Density    Patient will call when and if she would like to consider this   . Rash    Examine rash on face, tried polybacitracin     HPI:  81 yo female seen today with c/a abdominal pain, generalized arthralgias, rash on face. She was unable to afford linzess (>$200). She has a hx GERD and continues to have bloating, constipation and nausea. Tramadol helps with nausea and joint pain but is requiring escalating doses. She has a rash on her face since taking miralax several months ago. It improved after stopping medication but continues to have issues. She is applying topical bacitracin which is helping.  She still gets injections into her eyes for macular degenerations q6weeks.  She has rec'd injection into C-spine for spinal stenosis.   Past Medical History  Diagnosis Date  . Hypothyroid   . Diverticulitis   . Hypertension   . Hyperlipidemia   . Murmur   . GERD (gastroesophageal reflux disease)   . Endometriosis   . Pre-diabetes   . Macular degeneration   . High cholesterol   . Thyroid activity decreased   . Hearing loss   . Spinal stenosis     Dr. Luiz BlareGraves  . Arthritis     Past Surgical History  Procedure Laterality Date  . Abdominal hysterectomy    . Appendectomy    . Back surgery    . Tonsillectomy    . Ankle surgery    . Knee surgery    . Tumor removal      Patient Care Team: Kirt BoysMonica Willistine Ferrall, DO as PCP - General (Internal Medicine) Marvis Repressraig Greven, MD as Referring Physician (Ophthalmology) Lewayne BuntingBrian S Crenshaw, MD as Consulting Physician (Cardiology) Sanjuana LettersBenjamin Graves, MD as Referring Physician (Orthopedic Surgery)  History    Social History  . Marital Status: Widowed    Spouse Name: N/A  . Number of Children: 3  . Years of Education: N/A   Occupational History  . Not on file.   Social History Main Topics  . Smoking status: Never Smoker   . Smokeless tobacco: Never Used  . Alcohol Use: No  . Drug Use: No  . Sexual Activity: Not on file   Other Topics Concern  . Not on file   Social History Narrative   Diet: Fruit , Vegtables., Meat       Do you drink/ eat things with caffeine? 1 cup of coffee,tea      Marital status: Widow                               What year were you married ?1945      Do you live in a house, apartment,assistred living, condo, trailer, etc.)? Apartment Senior Living  (independent)      Is it one or more stories? 3       How many persons live in your home ? 1      Do you have any pets in your home ?(please list)  Yes (small dog)  Current or past profession: Diplomatic Services operational officer, Receptionist       Do you exercise?  Not much                            Type & how often: Walk with apt. Manger a little      Do you have a living will?  Yes      Do you have a DNR form?    Yes                    If not, do you want to discuss one?       Do you have signed POA?HPOA forms? Yes                If so, please bring to your        appointment           reports that she has never smoked. She has never used smokeless tobacco. She reports that she does not drink alcohol or use illicit drugs.  Allergies  Allergen Reactions  . Celexa [Citalopram Hydrobromide] Nausea And Vomiting  . Levaquin [Levofloxacin In D5w] Other (See Comments)    Hearing loss  . Red Yeast Rice Extract [Cholestin] Nausea Only  . Zocor [Simvastatin] Other (See Comments)    Elevated LFT    Medications: Patient's Medications  New Prescriptions   No medications on file  Previous Medications   ALPRAZOLAM (XANAX) 1 MG TABLET    Take 1 to 2 tablet 2 or 3 x daily as needed for anxiety or sleep.    BACITRACIN-POLYMYXIN B (POLY BACITRACIN EX)    Apply topically. As needed for facial rash   CHOLECALCIFEROL (VITAMIN D-3) 5000 UNITS TABS    Take by mouth.   LEVOTHYROXINE (SYNTHROID, LEVOTHROID) 75 MCG TABLET    1 by mouth daily EXCEPT 1/2 by mouth on Tuesday , 30 minutes before breakfast   MAGNESIUM HYDROXIDE (MILK OF MAGNESIA) 400 MG/5ML SUSPENSION    Take 5 mLs by mouth daily as needed for mild constipation.   MULTIPLE VITAMIN (MULTIVITAMIN) TABLET    Take 1 tablet by mouth daily.   TRAMADOL (ULTRAM) 50 MG TABLET    50 mg. 1 by mouth 3-4 times daily for pain  Modified Medications   Modified Medication Previous Medication   LOSARTAN (COZAAR) 25 MG TABLET losartan (COZAAR) 25 MG tablet      Take 1 tablet (25 mg total) by mouth as needed. For Blood Pressure    Take 1 tablet (25 mg total) by mouth daily. For Blood Pressure  Discontinued Medications   LINACLOTIDE (LINZESS) 145 MCG CAPS CAPSULE    Take 1 capsule (145 mcg total) by mouth daily.    Review of Systems  Constitutional: Negative for fever, chills, diaphoresis, activity change, appetite change and fatigue.  HENT: Negative for ear pain and sore throat.   Eyes: Negative for visual disturbance.  Respiratory: Positive for shortness of breath. Negative for cough and chest tightness.   Cardiovascular: Positive for palpitations. Negative for chest pain and leg swelling.  Gastrointestinal: Negative for nausea, vomiting, abdominal pain, diarrhea, constipation and blood in stool.  Genitourinary: Negative for dysuria.  Musculoskeletal: Positive for joint swelling, arthralgias and gait problem.  Neurological: Positive for dizziness. Negative for tremors, numbness and headaches.  Psychiatric/Behavioral: Negative for sleep disturbance. The patient is not nervous/anxious.     Filed Vitals:   01/13/15 1412  BP: 112/68  Pulse: 70  Temp: 98.3 F (36.8 C)  TempSrc: Oral  Resp: 16  Height: 5\' 1"  (1.549 m)  Weight: 110 lb (49.896 kg)  SpO2:  95%   Body mass index is 20.8 kg/(m^2).  Physical Exam  Constitutional: She is oriented to person, place, and time. She appears well-developed.  Frail appearing in NAD  HENT:  Mouth/Throat: Oropharynx is clear and moist. No oropharyngeal exudate.  Eyes: Pupils are equal, round, and reactive to light. No scleral icterus.  Neck: Neck supple. No tracheal deviation present. No thyromegaly present.  Cardiovascular: Normal rate, regular rhythm and intact distal pulses.  Exam reveals no gallop and no friction rub.   Murmur (2/6 SEM) heard. Pulses:      Carotid pulses are on the right side with bruit, and on the left side with bruit. No LE edema b/l. no calf TTP. Systolic bruit in neck  Pulmonary/Chest: Effort normal and breath sounds normal. No stridor. No respiratory distress. She has no wheezes. She has no rales.  Abdominal: Bowel sounds are normal. She exhibits distension. She exhibits no abdominal bruit and no mass. There is no hepatosplenomegaly. There is tenderness in the epigastric area. There is no rebound and no guarding. No hernia.  Lymphadenopathy:    She has no cervical adenopathy.  Neurological: She is alert and oriented to person, place, and time. She has normal reflexes.  Skin: Skin is warm, dry and intact. Rash noted.     Psychiatric: She has a normal mood and affect. Her speech is normal and behavior is normal. Judgment and thought content normal.     Labs reviewed: Office Visit on 12/25/2014  Component Date Value Ref Range Status  . WBC 12/25/2014 6.7  3.4 - 10.8 x10E3/uL Final  . RBC 12/25/2014 4.98  3.77 - 5.28 x10E6/uL Final  . Hemoglobin 12/25/2014 14.5  11.1 - 15.9 g/dL Final  . HCT 14/78/295604/15/2016 45.0  34.0 - 46.6 % Final  . MCV 12/25/2014 90  79 - 97 fL Final  . MCH 12/25/2014 29.1  26.6 - 33.0 pg Final  . MCHC 12/25/2014 32.2  31.5 - 35.7 g/dL Final  . RDW 21/30/865704/15/2016 13.5  12.3 - 15.4 % Final  . Platelets 12/25/2014 201  150 - 379 x10E3/uL Final  . Neutrophils  Relative % 12/25/2014 52   Final  . Lymphs 12/25/2014 39   Final  . Monocytes 12/25/2014 8   Final  . Eos 12/25/2014 1   Final  . Basos 12/25/2014 0   Final  . Neutrophils Absolute 12/25/2014 3.4  1.4 - 7.0 x10E3/uL Final  . Lymphocytes Absolute 12/25/2014 2.6  0.7 - 3.1 x10E3/uL Final  . Monocytes Absolute 12/25/2014 0.6  0.1 - 0.9 x10E3/uL Final  . Eosinophils Absolute 12/25/2014 0.1  0.0 - 0.4 x10E3/uL Final  . Basophils Absolute 12/25/2014 0.0  0.0 - 0.2 x10E3/uL Final  . Immature Granulocytes 12/25/2014 0   Final  . Immature Grans (Abs) 12/25/2014 0.0  0.0 - 0.1 x10E3/uL Final  . Glucose 12/25/2014 121* 65 - 99 mg/dL Final  . BUN 84/69/629504/15/2016 21  8 - 27 mg/dL Final  . Creatinine, Ser 12/25/2014 1.05* 0.57 - 1.00 mg/dL Final  . GFR calc non Af Amer 12/25/2014 47* >59 mL/min/1.73 Final  . GFR calc Af Amer 12/25/2014 54* >59 mL/min/1.73 Final  . BUN/Creatinine Ratio 12/25/2014 20  11 - 26 Final  . Sodium 12/25/2014 138  134 - 144 mmol/L Final  . Potassium 12/25/2014 4.1  3.5 - 5.2 mmol/L Final  .  Chloride 12/25/2014 97  97 - 108 mmol/L Final  . CO2 12/25/2014 27  18 - 29 mmol/L Final  . Calcium 12/25/2014 9.6  8.7 - 10.3 mg/dL Final  . Total Protein 12/25/2014 7.2  6.0 - 8.5 g/dL Final  . Albumin 16/06/9603 4.4  3.5 - 4.7 g/dL Final  . Globulin, Total 12/25/2014 2.8  1.5 - 4.5 g/dL Final  . Albumin/Globulin Ratio 12/25/2014 1.6  1.1 - 2.5 Final  . Bilirubin Total 12/25/2014 0.4  0.0 - 1.2 mg/dL Final  . Alkaline Phosphatase 12/25/2014 93  39 - 117 IU/L Final  . AST 12/25/2014 36  0 - 40 IU/L Final  . ALT 12/25/2014 19  0 - 32 IU/L Final  . TSH 12/25/2014 11.550* 0.450 - 4.500 uIU/mL Final  . Free T4 12/25/2014 0.92  0.82 - 1.77 ng/dL Final  . Hgb V4U MFr Bld 12/25/2014 5.8* 4.8 - 5.6 % Final   Comment:          Pre-diabetes: 5.7 - 6.4          Diabetes: >6.4          Glycemic control for adults with diabetes: <7.0   . Est. average glucose Bld gHb Est-m* 12/25/2014 120   Final     No results found.   Assessment/Plan   ICD-9-CM ICD-10-CM   1. Pain, joint, multiple sites 719.49 M25.50 CANCELED: ANA     CANCELED: Cyclic citrul peptide antibody, IgG     CANCELED: Anti-Smith antibody     CANCELED: Sjogren's syndrome antibods(ssa + ssb)     CANCELED: Anti-DNA antibody, double-stranded     CANCELED: RNP Antibody     CANCELED: Anti-scleroderma antibody     CANCELED: Uric acid     CANCELED: Sedimentation rate     CANCELED: Lupus Anticoagulant Panel  2. Gastroesophageal reflux disease without esophagitis 530.81 K21.9 esomeprazole (NEXIUM) 40 MG capsule  3. Undiagnosed cardiac murmurs 785.2 R01.0   4. Essential hypertension 401.9 I10 losartan (COZAAR) 25 MG tablet  5. Constipation, unspecified constipation type 564.00 K59.00   6.      Contact dermatitis - with eczema  --She declined rheumatologic tests due to cost. She will think about having them done at a later date. New Rx written for tramadol  --Try samples of linzess for constipation. Take 1 daily  --f/u with GI for abdominal pain/bloating/constipation. She will try samples of Linzess  --Follow up with cardiology as scheduled  --Continue current medications as ordered  --use moisturizing lotion liberally to dry skin. Continue topical bacitracin prn  --Follow up as scheduled  Chasty Randal S. Ancil Linsey  [facility] [address] [place], [place] [address] [phone] Cell (Monday-Friday 8 AM - 5 PM) [phone] After 5 PM and follow prompts

## 2015-01-14 ENCOUNTER — Telehealth: Payer: Self-pay | Admitting: *Deleted

## 2015-01-14 NOTE — Telephone Encounter (Signed)
Patient caregiver, Jola BabinskiMarilyn called and stated that the Nexium prescribed is too expensive and the pharmacist suggested Prilosec. Please Advise. Wants Rx faxed to [facility].

## 2015-01-15 MED ORDER — OMEPRAZOLE 40 MG PO CPDR: DELAYED_RELEASE_CAPSULE | ORAL | Status: DC

## 2015-01-15 NOTE — Telephone Encounter (Signed)
Ok for prilosec 40mg  #30 take 1 cap po daily with 4RF

## 2015-01-15 NOTE — Telephone Encounter (Signed)
Patient caregiver notified and faxed Rx to pharmacy.

## 2015-01-18 ENCOUNTER — Other Ambulatory Visit: Payer: Medicare Other

## 2015-01-18 DIAGNOSIS — M2550 Pain in unspecified joint: Secondary | ICD-10-CM | POA: Diagnosis not present

## 2015-01-19 ENCOUNTER — Encounter: Payer: Self-pay | Admitting: Internal Medicine

## 2015-01-20 ENCOUNTER — Emergency Department (HOSPITAL_COMMUNITY): Payer: Medicare Other

## 2015-01-20 ENCOUNTER — Emergency Department (HOSPITAL_COMMUNITY)
Admission: EM | Admit: 2015-01-20 | Discharge: 2015-01-20 | Disposition: A | Discharge status: Home / Self Care | Payer: Medicare Other | Attending: Emergency Medicine | Admitting: Emergency Medicine

## 2015-01-20 ENCOUNTER — Encounter (HOSPITAL_COMMUNITY): Payer: Self-pay

## 2015-01-20 DIAGNOSIS — M25569 Pain in unspecified knee: Secondary | ICD-10-CM | POA: Diagnosis not present

## 2015-01-20 DIAGNOSIS — K219 Gastro-esophageal reflux disease without esophagitis: Secondary | ICD-10-CM | POA: Insufficient documentation

## 2015-01-20 DIAGNOSIS — H9190 Unspecified hearing loss, unspecified ear: Secondary | ICD-10-CM | POA: Insufficient documentation

## 2015-01-20 DIAGNOSIS — Z87448 Personal history of other diseases of urinary system: Secondary | ICD-10-CM | POA: Diagnosis not present

## 2015-01-20 DIAGNOSIS — G8929 Other chronic pain: Secondary | ICD-10-CM | POA: Diagnosis not present

## 2015-01-20 DIAGNOSIS — M25561 Pain in right knee: Secondary | ICD-10-CM | POA: Diagnosis not present

## 2015-01-20 DIAGNOSIS — Z8719 Personal history of other diseases of the digestive system: Secondary | ICD-10-CM | POA: Insufficient documentation

## 2015-01-20 DIAGNOSIS — I10 Essential (primary) hypertension: Secondary | ICD-10-CM | POA: Insufficient documentation

## 2015-01-20 DIAGNOSIS — E039 Hypothyroidism, unspecified: Secondary | ICD-10-CM | POA: Insufficient documentation

## 2015-01-20 DIAGNOSIS — R011 Cardiac murmur, unspecified: Secondary | ICD-10-CM | POA: Insufficient documentation

## 2015-01-20 DIAGNOSIS — Z79899 Other long term (current) drug therapy: Secondary | ICD-10-CM | POA: Insufficient documentation

## 2015-01-20 LAB — CYCLIC CITRUL PEPTIDE ANTIBODY, IGG/IGA: Cyclic Citrullin Peptide Ab: 7 units (ref 0–19)

## 2015-01-20 MED ORDER — HYDROCODONE-ACETAMINOPHEN 5-325 MG PO TABS: 1.0000 | ORAL_TABLET | Freq: Once | ORAL | Status: DC

## 2015-01-20 MED ORDER — HYDROCODONE-ACETAMINOPHEN 5-325 MG PO TABS: 1.0000 | ORAL_TABLET | Freq: Four times a day (QID) | ORAL | Status: DC | PRN

## 2015-01-20 MED ORDER — HYDROCODONE-ACETAMINOPHEN 5-325 MG PO TABS
1.0000 | ORAL_TABLET | Freq: Once | ORAL | Status: AC
  Administered 2015-01-20: 1 via ORAL
  Filled 2015-01-20: qty 1

## 2015-01-20 NOTE — Discharge Instructions (Signed)

## 2015-01-20 NOTE — Progress Notes (Signed)
ED PA/NP discussed pt need to go to a higher level of care vs present independent living at [facility] in [id number] [place]  ED Cm and ED Np?PA spoke with ED SW.

## 2015-01-20 NOTE — ED Provider Notes (Signed)
CSN: [id number]     Arrival date & time 01/20/15  1022 History   First MD Initiated Contact with Patient 01/20/15 1049     Chief Complaint  Patient presents with  . Leg Pain  . Knee Pain     (Consider location/radiation/quality/duration/timing/severity/associated sxs/prior Treatment) HPI Comments: Patient presents to the emergency department with chief complaint of right knee pain. Its that she has chronic knee problems. Has had multiple surgeries on her knee. Denies any mechanism of injury. Denies any fevers, chills, nausea, or vomiting. She has tried taking tramadol with mild relief. Her orthopedic doctor is Dr. Luiz BlareGraves. Pain is worsened with movement and ambulation.  The history is provided by the patient. No language interpreter was used.    Past Medical History  Diagnosis Date  . Hypothyroid   . Diverticulitis   . Hypertension   . Hyperlipidemia   . Murmur   . GERD (gastroesophageal reflux disease)   . Endometriosis   . Pre-diabetes   . Macular degeneration   . High cholesterol   . Thyroid activity decreased   . Hearing loss   . Spinal stenosis     Dr. Luiz BlareGraves  . Arthritis    Past Surgical History  Procedure Laterality Date  . Abdominal hysterectomy    . Appendectomy    . Back surgery    . Tonsillectomy    . Ankle surgery    . Knee surgery    . Tumor removal     Family History  Problem Relation Age of Onset  . Heart disease Sister     CHF in her 6180s  . Heart disease Mother   . Diabetes Father   . Heart disease Father   . Gout Son   . Panic disorder Daughter   . Arthritis Mother   . Osteoporosis Sister    History  Substance Use Topics  . Smoking status: Never Smoker   . Smokeless tobacco: Never Used  . Alcohol Use: No   OB History    No data available     Review of Systems  Constitutional: Negative for fever and chills.  Respiratory: Negative for shortness of breath.   Cardiovascular: Negative for chest pain.  Gastrointestinal: Negative for  nausea, vomiting, diarrhea and constipation.  Genitourinary: Negative for dysuria.  Musculoskeletal: Positive for arthralgias.  All other systems reviewed and are negative.     Allergies  Celexa; Levaquin; Red yeast rice extract; and Zocor  Home Medications   Prior to Admission medications   Medication Sig Start Date End Date Taking? Authorizing Provider  ALPRAZolam Prudy Feeler(XANAX) 1 MG tablet Take 1 to 2 tablet 2 or 3 x daily as needed for anxiety or sleep. 12/25/14   Kirt BoysMonica Carter, DO  Bacitracin-Polymyxin B (POLY BACITRACIN EX) Apply topically. As needed for facial rash    Historical Provider, MD  Cholecalciferol (VITAMIN D-3) 5000 UNITS TABS Take by mouth.    Historical Provider, MD  levothyroxine (SYNTHROID, LEVOTHROID) 75 MCG tablet 1 by mouth daily EXCEPT 1/2 by mouth on Tuesday , 30 minutes before breakfast 12/29/14   Kirt BoysMonica Carter, DO  losartan (COZAAR) 25 MG tablet Take 1 tablet (25 mg total) by mouth as needed. For Blood Pressure 01/13/15   Kirt BoysMonica Carter, DO  magnesium hydroxide (MILK OF MAGNESIA) 400 MG/5ML suspension Take 5 mLs by mouth daily as needed for mild constipation.    Historical Provider, MD  Multiple Vitamin (MULTIVITAMIN) tablet Take 1 tablet by mouth daily.    Historical Provider, MD  omeprazole (  PRILOSEC) 40 MG capsule Take one capsule by mouth once daily for stomach 01/15/15   Kirt BoysMonica Carter, DO  traMADol (ULTRAM) 50 MG tablet Take 1 tablet (50 mg total) by mouth every 6 (six) hours as needed. 1 by mouth 3-4 times daily for pain 01/13/15   Kirt BoysMonica Carter, DO   BP 136/66 mmHg  Pulse 82  Temp(Src) 98.1 F (36.7 C) (Oral)  Resp 18  Ht 5' (1.524 m)  Wt 108 lb (48.988 kg)  BMI 21.09 kg/m2  SpO2 94% Physical Exam  Constitutional: She is oriented to person, place, and time. She appears well-developed and well-nourished.  HENT:  Head: Normocephalic and atraumatic.  Eyes: Conjunctivae and EOM are normal. Pupils are equal, round, and reactive to light.  Neck: Normal range of  motion. Neck supple.  Cardiovascular: Normal rate and regular rhythm.  Exam reveals no gallop and no friction rub.   No murmur heard. Pulmonary/Chest: Effort normal and breath sounds normal. No respiratory distress. She has no wheezes. She has no rales. She exhibits no tenderness.  Abdominal: Soft. Bowel sounds are normal. She exhibits no distension and no mass. There is no tenderness. There is no rebound and no guarding.  Musculoskeletal: Normal range of motion. She exhibits no edema or tenderness.  Right knee painful with range of motion, no erythema, no swelling  Neurological: She is alert and oriented to person, place, and time.  Skin: Skin is warm and dry.  Psychiatric: She has a normal mood and affect. Her behavior is normal. Judgment and thought content normal.  Nursing note and vitals reviewed.   ED Course  Procedures (including critical care time) Labs Review Labs Reviewed - No data to display  Imaging Review Dg Knee Complete 4 Views Right  01/20/2015   CLINICAL DATA:  Two day history of pain.  No recent trauma  EXAM: RIGHT KNEE - COMPLETE 4+ VIEW  COMPARISON:  None.  FINDINGS: Frontal, lateral, and bilateral oblique views were obtained. The patient is status post total knee replacement with prosthetic components appearing well-seated. There is rod fixation through the mid the distal femur with remodeling from old fracture. There is no acute fracture or dislocation. No erosive change or bony destruction.  IMPRESSION: Prosthetic components appear well seated. Bony remodeling distal femur without acute fracture or dislocation. No erosive change or bony destruction. No appreciable knee joint effusion.   Electronically Signed   By: Bretta BangWilliam  Woodruff III M.D.   On: 01/20/2015 11:42     EKG Interpretation None      MDM   Final diagnoses:  Knee pain, acute, right    Patient with acute on chronic right knee pain. No MOI. Will check plain films right knee. Vital signs are stable.  Patient is otherwise well-appearing.  Plain films are negative. Patient states that her pain has significantly improved with the Vicodin. Patient adamantly refused increasing her care from independent living to skilled nursing, she was however agreeable to home health aid, and physical therapy home health. I discussed this with the case manager, and have arranged for this to happen. Will also prescribe some Vicodin, and will recommend that she discontinue the tramadol.  Roxy Horsemanobert Terrika Zuver, PA-C 01/20/15 1443  Purvis SheffieldForrest Harrison, MD 01/21/15 [phone]

## 2015-01-20 NOTE — Progress Notes (Signed)
89 yr o.ld [employer] [place] [facility] [place] Refused higher level of care in [facility] Pt has supportive couple from church at bedside Bonita Quin(Linda and Gery Praybarry) another couple arrived to visit prior to CM leaving.  New friends made inquiry about pt dx and CM referred them to ED PA/NP.  Pt refuses for [facility]. Pt wants to try at home without services Pt reports being "independent before now and I have a little dog I'm worried about"  [facility] loopy from medicine provided for pain in ED. CM assessed for DME Pt has walker, cane, w/c and scooter at home. CM assess for ability to get meds and food Pt states her friends present can assist and she has wheels on meals that was scheduled to come "today but I tried to cancel the delivery by calling them"  Pt informed CM that she can use her left leg to accomodate for the right leg. Pt asking about use of w/c to get her to her friend car.    CM reviewed in details medicare guidelines, home health [facility](HH) (length of stay in home, [facility] available, coverage, primary caregiver, up to 24 hrs before services may be started), Private duty nursing (PDN-coverage, length of stay in the home types of staff available),  CM provided pt with a list of [place] home health agencies, PDN. CM discussed with pt if after her trial period at home and she decides she needs further services, possibly home health, her pcp can assist with orders, plus CM would have EDP PA/NP to order [facility], [facility] and Aide just in case on f/u with pt these services need initiating Pt agreed to this and became tearful with arrival of friends. Pt stated "this is overwhelming"  ED PA ED RN updated on refusal of services but pt to receive f/u call from cm

## 2015-01-20 NOTE — ED Notes (Signed)
Patient transported to X-ray 

## 2015-01-20 NOTE — ED Notes (Signed)
Bed: [id number] Expected date:  Expected time:  Means of arrival:  Comments: 81 y/o knee pain

## 2015-01-20 NOTE — ED Notes (Signed)
Per PTAR. Pt resides at home. Independent living. Pt denies injury to RLE. C/o of rt knee pain. Good CMS. Not weight bearing at present however is ambulatory.

## 2015-01-21 NOTE — Progress Notes (Signed)
[facility] after right knee issues with refusal of home health services in the care of her friends [facility]  When Cm asked pt how she was doing today she stated  "sort of drunk on hydrocodone. My leg is much better today as long as I don't bend."  When CM inquired if pt had made a f/u appt with pcp, Kirt BoysMonica Carter she stated she tried but "I think they are having phone problems." Pt reports she "Got a knee brace from drug store." to keep her knee straightened.  Pt confirms again she has a walker but "It has not done any good right now."  CM inquired about getting her a w/c but the pt refused at this time and reports "If it does not get any better I will ask Dr Montez Moritaarter for at wheelchair:" "I have a friend coming over to be with me this afternoon." Pt thanked CM for the f/u call. Cm offered to send Dr Montez Moritaarter an in basket message to updated her and have Dr Montez Moritaarter office to check on pt for further needs.  Pt also states she had completed labs at Dr Celene Skeenarter's office recently and the office was suppose to call her back to discuss the lab Pt agreed to Cm sending Dr Montez Moritaarter an in basket message [phone] CM sent an in basket message to Dr Montez Moritaarter updating her and requesting a call to pt for lab, and possible home health and DME needs

## 2015-01-22 ENCOUNTER — Ambulatory Visit: Payer: Medicare Other | Admitting: Internal Medicine

## 2015-01-22 ENCOUNTER — Encounter: Payer: Self-pay | Admitting: Internal Medicine

## 2015-01-22 ENCOUNTER — Ambulatory Visit (INDEPENDENT_AMBULATORY_CARE_PROVIDER_SITE_OTHER): Payer: Medicare Other | Admitting: Internal Medicine

## 2015-01-22 ENCOUNTER — Telehealth: Payer: Self-pay

## 2015-01-22 VITALS — BP 120/58 | HR 69 | Temp 98.0°F | Resp 20 | Ht 61.0 in

## 2015-01-22 DIAGNOSIS — M2550 Pain in unspecified joint: Secondary | ICD-10-CM

## 2015-01-22 DIAGNOSIS — K5900 Constipation, unspecified: Secondary | ICD-10-CM | POA: Diagnosis not present

## 2015-01-22 DIAGNOSIS — M25561 Pain in right knee: Secondary | ICD-10-CM | POA: Diagnosis not present

## 2015-01-22 MED ORDER — HYDROCODONE-ACETAMINOPHEN 5-325 MG PO TABS: 1.0000 | ORAL_TABLET | Freq: Four times a day (QID) | ORAL | Status: DC | PRN

## 2015-01-22 NOTE — Patient Instructions (Signed)
Take linzess 145 mcg daily for constipation. Samples provided  Continue hydrocodone as needed for pain  Continue other medications as ordered  Follow up in 1 month

## 2015-01-22 NOTE — Progress Notes (Signed)
Patient ID: Kristie [name], female   DOB: [birth year], 81 y.o.   MRN: [id number]    Location:    [facility]   Place of Service:   OFFICE    Chief Complaint  Patient presents with  . Acute Visit    Patient c/o knee is swollen and feels hot    HPI:  81 yo female seen today for ER f/u. She has severe right knee pain and swelling. She has difficulty weightbearing. She has pain in medial knee and it feels hot. No recent falls or other trauma. She continues to have generalized pain. States knee pain sudden onset. No f/c.   Past Medical History  Diagnosis Date  . Hypothyroid   . Diverticulitis   . Hypertension   . Hyperlipidemia   . Murmur   . GERD (gastroesophageal reflux disease)   . Endometriosis   . Pre-diabetes   . Macular degeneration   . High cholesterol   . Thyroid activity decreased   . Hearing loss   . Spinal stenosis     Dr. Luiz Blare  . Arthritis     Past Surgical History  Procedure Laterality Date  . Abdominal hysterectomy    . Appendectomy    . Back surgery    . Tonsillectomy    . Ankle surgery    . Knee surgery    . Tumor removal      Patient Care Team: Kirt Boys, DO as PCP - General (Internal Medicine) Marvis Repress, MD as Referring Physician (Ophthalmology) Lewayne Bunting, MD as Consulting Physician (Cardiology) Sanjuana Letters, MD as Referring Physician (Orthopedic Surgery)  History   Social History  . Marital Status: Widowed    Spouse Name: N/A  . Number of Children: 3  . Years of Education: N/A   Occupational History  . Not on file.   Social History Main Topics  . Smoking status: Never Smoker   . Smokeless tobacco: Never Used  . Alcohol Use: No  . Drug Use: No  . Sexual Activity: Not on file   Other Topics Concern  . Not on file   Social History Narrative   Diet: Fruit , Vegtables., Meat       Do you drink/ eat things with caffeine? 1 cup of coffee,tea      Marital status: Widow                               What year were you  married ?1945      Do you live in a house, apartment,assistred living, condo, trailer, etc.)? Apartment Senior Living  (independent)      Is it one or more stories? 3       How many persons live in your home ? 1      Do you have any pets in your home ?(please list)  Yes (small dog)      Current or past profession: Diplomatic Services operational officer, Receptionist       Do you exercise?  Not much                            Type & how often: Walk with apt. Manger a little      Do you have a living will?  Yes      Do you have a DNR form?    Yes  If not, do you want to discuss one?       Do you have signed POA?HPOA forms? Yes                If so, please bring to your        appointment           reports that she has never smoked. She has never used smokeless tobacco. She reports that she does not drink alcohol or use illicit drugs.  Allergies  Allergen Reactions  . Celexa [Citalopram Hydrobromide] Nausea And Vomiting  . Levaquin [Levofloxacin In D5w] Other (See Comments)    Hearing loss  . Red Yeast Rice Extract [Cholestin] Nausea Only  . Zocor [Simvastatin] Other (See Comments)    Elevated LFT, muscles and bones felt sore  . Ciprofloxacin Anxiety    Felt like she was going to lose her mind or pass out    Medications: Patient's Medications  New Prescriptions   No medications on file  Previous Medications   ALPRAZOLAM (XANAX) 1 MG TABLET    Take 1 to 2 tablet 2 or 3 x daily as needed for anxiety or sleep.   BEVACIZUMAB (AVASTIN IV)    Inject 1 each into the vein every 30 (thirty) days.   CHOLECALCIFEROL (VITAMIN D-3) 5000 UNITS TABS    Take 5,000 Units by mouth daily.    HYDROCODONE-ACETAMINOPHEN (NORCO/VICODIN) 5-325 MG PER TABLET    Take 1 tablet by mouth every 6 (six) hours as needed.   LEVOTHYROXINE (SYNTHROID, LEVOTHROID) 75 MCG TABLET    Take 37.5-75 mcg by mouth daily before breakfast. 1 by mouth daily EXCEPT 1/2 by mouth on Tuesday , 30 minutes before breakfast    MAGNESIUM HYDROXIDE (MILK OF MAGNESIA) 400 MG/5ML SUSPENSION    Take 5 mLs by mouth daily as needed for mild constipation.   MULTIPLE VITAMIN (MULTIVITAMIN) TABLET    Take 1 tablet by mouth daily.   OMEPRAZOLE (PRILOSEC) 40 MG CAPSULE    Take one capsule by mouth once daily for stomach   TRAMADOL (ULTRAM) 50 MG TABLET    Take 1 tablet (50 mg total) by mouth every 6 (six) hours as needed. 1 by mouth 3-4 times daily for pain  Modified Medications   No medications on file  Discontinued Medications   No medications on file    Review of Systems  Constitutional: Negative for fever and chills.  Musculoskeletal: Positive for myalgias, back pain, joint swelling, arthralgias, gait problem and neck pain.  Neurological: Negative for numbness.  All other systems reviewed and are negative.   Filed Vitals:   01/22/15 1617  BP: 120/58  Pulse: 69  Temp: 98 F (36.7 C)  TempSrc: Oral  Resp: 20  Height:  (1.549 m)  SpO2: 94%   There is no weight on file to calculate BMI.  Physical Exam  Constitutional: She is oriented to person, place, and time. She appears well-developed and well-nourished. No distress.  Sitting in w/c. Looks uncomfortable  Musculoskeletal: She exhibits edema and tenderness.  Medial right knee swelling with TTP and increased warmth to touch. Reduced flexion. No popliteal mass palpable. No calf TTP  Neurological: She is alert and oriented to person, place, and time.  Skin: Skin is warm and dry. No rash noted.  Psychiatric: She has a normal mood and affect. Her behavior is normal.     Labs reviewed: Lab on 01/18/2015  Component Date Value Ref Range Status  . Cyclic Citrullin Peptide Ab 56/21/3086  7  0 - 19 units Final   Comment:                           Negative               <20                           Weak positive      20 - 39                           Moderate positive  40 - 59                           Strong positive        >59   Office Visit on 12/25/2014   Component Date Value Ref Range Status  . WBC 12/25/2014 6.7  3.4 - 10.8 x10E3/uL Final  . RBC 12/25/2014 4.98  3.77 - 5.28 x10E6/uL Final  . Hemoglobin 12/25/2014 14.5  11.1 - 15.9 g/dL Final  . HCT 16/10/960404/15/2016 45.0  34.0 - 46.6 % Final  . MCV 12/25/2014 90  79 - 97 fL Final  . MCH 12/25/2014 29.1  26.6 - 33.0 pg Final  . MCHC 12/25/2014 32.2  31.5 - 35.7 g/dL Final  . RDW 54/09/811904/15/2016 13.5  12.3 - 15.4 % Final  . Platelets 12/25/2014 201  150 - 379 x10E3/uL Final  . Neutrophils Relative % 12/25/2014 52   Final  . Lymphs 12/25/2014 39   Final  . Monocytes 12/25/2014 8   Final  . Eos 12/25/2014 1   Final  . Basos 12/25/2014 0   Final  . Neutrophils Absolute 12/25/2014 3.4  1.4 - 7.0 x10E3/uL Final  . Lymphocytes Absolute 12/25/2014 2.6  0.7 - 3.1 x10E3/uL Final  . Monocytes Absolute 12/25/2014 0.6  0.1 - 0.9 x10E3/uL Final  . Eosinophils Absolute 12/25/2014 0.1  0.0 - 0.4 x10E3/uL Final  . Basophils Absolute 12/25/2014 0.0  0.0 - 0.2 x10E3/uL Final  . Immature Granulocytes 12/25/2014 0   Final  . Immature Grans (Abs) 12/25/2014 0.0  0.0 - 0.1 x10E3/uL Final  . Glucose 12/25/2014 121* 65 - 99 mg/dL Final  . BUN 14/78/295604/15/2016 21  8 - 27 mg/dL Final  . Creatinine, Ser 12/25/2014 1.05* 0.57 - 1.00 mg/dL Final  . GFR calc non Af Amer 12/25/2014 47* >59 mL/min/1.73 Final  . GFR calc Af Amer 12/25/2014 54* >59 mL/min/1.73 Final  . BUN/Creatinine Ratio 12/25/2014 20  11 - 26 Final  . Sodium 12/25/2014 138  134 - 144 mmol/L Final  . Potassium 12/25/2014 4.1  3.5 - 5.2 mmol/L Final  . Chloride 12/25/2014 97  97 - 108 mmol/L Final  . CO2 12/25/2014 27  18 - 29 mmol/L Final  . Calcium 12/25/2014 9.6  8.7 - 10.3 mg/dL Final  . Total Protein 12/25/2014 7.2  6.0 - 8.5 g/dL Final  . Albumin 21/30/865704/15/2016 4.4  3.5 - 4.7 g/dL Final  . Globulin, Total 12/25/2014 2.8  1.5 - 4.5 g/dL Final  . Albumin/Globulin Ratio 12/25/2014 1.6  1.1 - 2.5 Final  . Bilirubin Total 12/25/2014 0.4  0.0 - 1.2 mg/dL Final  .  Alkaline Phosphatase 12/25/2014 93  39 - 117 IU/L Final  . AST 12/25/2014 36  0 - 40 IU/L Final  . ALT 12/25/2014  19  0 - 32 IU/L Final  . TSH 12/25/2014 11.550* 0.450 - 4.500 uIU/mL Final  . Free T4 12/25/2014 0.92  0.82 - 1.77 ng/dL Final  . Hgb R6EA1c MFr Bld 12/25/2014 5.8* 4.8 - 5.6 % Final   Comment:          Pre-diabetes: 5.7 - 6.4          Diabetes: >6.4          Glycemic control for adults with diabetes: <7.0   . Est. average glucose Bld gHb Est-m* 12/25/2014 120   Final    Dg Knee Complete 4 Views Right  01/20/2015   CLINICAL DATA:  Two day history of pain.  No recent trauma  EXAM: RIGHT KNEE - COMPLETE 4+ VIEW  COMPARISON:  None.  FINDINGS: Frontal, lateral, and bilateral oblique views were obtained. The patient is status post total knee replacement with prosthetic components appearing well-seated. There is rod fixation through the mid the distal femur with remodeling from old fracture. There is no acute fracture or dislocation. No erosive change or bony destruction.  IMPRESSION: Prosthetic components appear well seated. Bony remodeling distal femur without acute fracture or dislocation. No erosive change or bony destruction. No appreciable knee joint effusion.   Electronically Signed   By: Bretta BangWilliam  Woodruff III M.D.   On: 01/20/2015 11:42     Assessment/Plan    ICD-9-CM ICD-10-CM   1. Right knee pain 719.46 M25.561 HYDROcodone-acetaminophen (NORCO/VICODIN) 5-325 MG per tablet     Ambulatory referral to Orthopedic Surgery  2. Pain, joint, multiple sites - failing to change as expected 719.49 M25.50 HYDROcodone-acetaminophen (NORCO/VICODIN) 5-325 MG per tablet     Ambulatory referral to Rheumatology  3. Constipation, unspecified constipation type - stable 564.00 K59.00    --Take linzess 145 mcg daily for constipation. Samples provided  --Continue hydrocodone as needed for pain  --Continue other medications as ordered  --Follow up in 1 month to reassess joint pain  Makiah Clauson S.  Ancil Linseyarter, D. O., F. A. C. O. I.  [facility] [address] [place], [place] [address] [phone] Cell (Monday-Friday 8 AM - 5 PM) [phone] After 5 PM and follow prompts

## 2015-01-22 NOTE — Telephone Encounter (Signed)
Called patient to get more information about knee pain. Patient states she was prescribed Hydrocodone and it helps with the pain yet it makes her feel woozy. Patient was only given 10 pills with the instructions to take every 6 hours as needed. Patient also has tramadol on hand that she was told to hold while taking Hydrocodone. Patient had knee xray that did not reveal anything.  Patient questions if anything could be going on that would not show up on xray. Patient states the pain is excruciating without the use of pain medications. Please advise if patient to keep appointment at 4 pm or if concern can be handled via phone due to recently seen for concern in ER

## 2015-01-22 NOTE — Telephone Encounter (Signed)
Kristie DubinMarilyn [name] called on be half of  Kristie [name] because she got a call from the office about not coming in today to see Dr. Montez Moritaarter. I read the note from Chrae, waiting for Dr. Montez Moritaarter to respond to the message. The reason they were coming in because the ER checked if her knee replacement was ok, but her leg is warm and swollen and concern she has a blood clot. Told her to keep 4:00 appt.

## 2015-01-25 NOTE — Telephone Encounter (Signed)
Patient was seen. 

## 2015-01-25 NOTE — Progress Notes (Signed)
HPI: 81 year old female for evaluation of murmur. I saw the patient in June 2014 for palpitations. Also monitor was ordered but not performed. We scheduled a nuclear study for atypical chest pain. She was also noted to have an aortic stenosis murmur. An echocardiogram was ordered but she did not come for that study. [facility] showed no pulmonary embolus, chronic lung disease and small dominant nodules. Follow-up recommended. Recent laboratories in April 2016 showed TSH of 11.5 and normal hemoglobin.  Current Outpatient Prescriptions  Medication Sig Dispense Refill  . ALPRAZolam (XANAX) 1 MG tablet Take 1 to 2 tablet 2 or 3 x daily as needed for anxiety or sleep. (Patient taking differently: Take 1-2 mg by mouth 3 (three) times daily as needed for anxiety or sleep. ) 90 tablet 5  . Bevacizumab (AVASTIN IV) Inject 1 each into the vein every 30 (thirty) days.    . Cholecalciferol (VITAMIN D-3) 5000 UNITS TABS Take 5,000 Units by mouth daily.     Marland Kitchen. HYDROcodone-acetaminophen (NORCO/VICODIN) 5-325 MG per tablet Take 1 tablet by mouth every 6 (six) hours as needed. 60 tablet 0  . levothyroxine (SYNTHROID, LEVOTHROID) 75 MCG tablet Take 37.5-75 mcg by mouth daily before breakfast. 1 by mouth daily EXCEPT 1/2 by mouth on Tuesday , 30 minutes before breakfast 90 tablet 99  . magnesium hydroxide (MILK OF MAGNESIA) 400 MG/5ML suspension Take 5 mLs by mouth daily as needed for mild constipation.    . Multiple Vitamin (MULTIVITAMIN) tablet Take 1 tablet by mouth daily.    Marland Kitchen. omeprazole (PRILOSEC) 40 MG capsule Take one capsule by mouth once daily for stomach (Patient taking differently: Take 40 mg by mouth daily. ) 30 capsule 4  . traMADol (ULTRAM) 50 MG tablet Take 1 tablet (50 mg total) by mouth every 6 (six) hours as needed. 1 by mouth 3-4 times daily for pain (Patient taking differently: Take 50 mg by mouth every 6 (six) hours as needed (pain). ) 90 tablet 0   No current  facility-administered medications for this visit.    Allergies  Allergen Reactions  . Celexa [Citalopram Hydrobromide] Nausea And Vomiting  . Levaquin [Levofloxacin In D5w] Other (See Comments)    Hearing loss  . Red Yeast Rice Extract [Cholestin] Nausea Only  . Zocor [Simvastatin] Other (See Comments)    Elevated LFT, muscles and bones felt sore  . Ciprofloxacin Anxiety    Felt like she was going to lose her mind or pass out    Past Medical History  Diagnosis Date  . Hypothyroid   . Diverticulitis   . Hypertension   . Hyperlipidemia   . Murmur   . GERD (gastroesophageal reflux disease)   . Endometriosis   . Pre-diabetes   . Macular degeneration   . High cholesterol   . Thyroid activity decreased   . Hearing loss   . Spinal stenosis     Dr. Luiz BlareGraves  . Arthritis     Past Surgical History  Procedure Laterality Date  . Abdominal hysterectomy    . Appendectomy    . Back surgery    . Tonsillectomy    . Ankle surgery    . Knee surgery    . Tumor removal      History   Social History  . Marital Status: Widowed    Spouse Name: N/A  . Number of Children: 3  . Years of Education: N/A   Occupational History  . Not on file.  Social History Main Topics  . Smoking status: Never Smoker   . Smokeless tobacco: Never Used  . Alcohol Use: No  . Drug Use: No  . Sexual Activity: Not on file   Other Topics Concern  . Not on file   Social History Narrative   Diet: Fruit , Vegtables., Meat       Do you drink/ eat things with caffeine? 1 cup of coffee,tea      Marital status: Widow                               What year were you married ?1945      Do you live in a house, apartment,assistred living, condo, trailer, etc.)? Apartment Senior Living  (independent)      Is it one or more stories? 3       How many persons live in your home ? 1      Do you have any pets in your home ?(please list)  Yes (small dog)      Current or past profession: Diplomatic Services operational officerecretary,  Receptionist       Do you exercise?  Not much                            Type & how often: Walk with apt. Manger a little      Do you have a living will?  Yes      Do you have a DNR form?    Yes                    If not, do you want to discuss one?       Do you have signed POA?HPOA forms? Yes                If so, please bring to your        appointment          Family History  Problem Relation Age of Onset  . Heart disease Sister     CHF in her 7780s  . Heart disease Mother   . Diabetes Father   . Heart disease Father   . Gout Son   . Panic disorder Daughter   . Arthritis Mother   . Osteoporosis Sister     ROS: no fevers or chills, productive cough, hemoptysis, dysphasia, odynophagia, melena, hematochezia, dysuria, hematuria, rash, seizure activity, orthopnea, PND, pedal edema, claudication. Remaining systems are negative.  Physical Exam:   There were no vitals taken for this visit.  General:  Well developed/well nourished in NAD Skin warm/dry Patient not depressed No peripheral clubbing Back-normal HEENT-normal/normal eyelids Neck supple/normal carotid upstroke bilaterally; no bruits; no JVD; no thyromegaly chest - CTA/ normal expansion CV - RRR/normal S1 and S2; no murmurs, rubs or gallops;  PMI nondisplaced Abdomen -NT/ND, no HSM, no mass, + bowel sounds, no bruit 2+ femoral pulses, no bruits Ext-no edema, chords, 2+ DP Neuro-grossly nonfocal  ECG 10/27/2014-sinus rhythm, nonspecific ST changes.   This encounter was created in error - please disregard.

## 2015-01-27 ENCOUNTER — Telehealth: Payer: Self-pay | Admitting: Cardiology

## 2015-01-28 NOTE — Telephone Encounter (Signed)
Close encounter 

## 2015-01-29 ENCOUNTER — Encounter: Payer: Self-pay | Admitting: Cardiology

## 2015-02-04 DIAGNOSIS — H3532 Exudative age-related macular degeneration: Secondary | ICD-10-CM | POA: Diagnosis not present

## 2015-02-26 ENCOUNTER — Ambulatory Visit: Payer: Medicare Other | Admitting: Internal Medicine

## 2015-03-03 ENCOUNTER — Encounter: Payer: Self-pay | Admitting: Behavioral Health

## 2015-03-03 ENCOUNTER — Ambulatory Visit: Payer: Medicare Other | Admitting: Internal Medicine

## 2015-03-03 ENCOUNTER — Telehealth: Payer: Self-pay | Admitting: Behavioral Health

## 2015-03-03 NOTE — Telephone Encounter (Signed)
Pre-Visit Call completed with patient and chart updated.   Pre-Visit Info documented in Specialty Comments under SnapShot.    

## 2015-03-04 ENCOUNTER — Encounter: Payer: Self-pay | Admitting: Medical

## 2015-03-04 ENCOUNTER — Ambulatory Visit (INDEPENDENT_AMBULATORY_CARE_PROVIDER_SITE_OTHER): Payer: Medicare Other | Admitting: Medical

## 2015-03-04 VITALS — BP 128/103 | HR 84 | Temp 98.4°F | Ht 61.0 in | Wt 110.0 lb

## 2015-03-04 DIAGNOSIS — M4802 Spinal stenosis, cervical region: Secondary | ICD-10-CM | POA: Diagnosis not present

## 2015-03-04 DIAGNOSIS — I10 Essential (primary) hypertension: Secondary | ICD-10-CM

## 2015-03-04 DIAGNOSIS — G4700 Insomnia, unspecified: Secondary | ICD-10-CM

## 2015-03-04 DIAGNOSIS — M159 Polyosteoarthritis, unspecified: Secondary | ICD-10-CM

## 2015-03-04 DIAGNOSIS — K5909 Other constipation: Secondary | ICD-10-CM

## 2015-03-04 DIAGNOSIS — E038 Other specified hypothyroidism: Secondary | ICD-10-CM | POA: Diagnosis not present

## 2015-03-04 NOTE — Assessment & Plan Note (Signed)
Pt takes xanax one tablet at night has been on this for years. She is new to our office. Not ideal but has been on.

## 2015-03-04 NOTE — Progress Notes (Signed)
Subjective:    Patient ID: Kristie [name], female    DOB: 1922-10-02, 81 y.o.   MRN: [id number]  HPI  I have reviewed pt PMH, PSH, FH, Social History and Surgical History  Pt has history of spinal stenosis. Pt orthopedist does not want to do surgery due to her age. Pt see Wang. She gets some injections in the past. This MD is likely neurosurgeon. She reqest referral to him.  Pt also has some arthritis various joints. Pt states recent hospital gave vicodin for knee pain. Pt states tramadol caused constipation. Pt states she has reaction to Miralax. Now on milk of magnesia. Pain usually on severe side.  Pt states her bowel movement area usually very small. Pt states she saw on my chart that she was due for colonosocpy.   Pt Doctor was nearing retirement and she was let go. Pt tried 2 local MD but was not happy with those facilities.  Pt states she takes xanax 1 mg at night. Has taken for years for insomnia  hypothyoid- on levothryoxine.   Elevated bp only when she gets in pain. Pt states her diastolic is usually 60 unless in pain.  Low vitamin D hx.   Review of Systems  Constitutional: Negative for fever, chills, diaphoresis, activity change and fatigue.  Respiratory: Negative for cough, chest tightness and shortness of [name].   Cardiovascular: Negative for chest pain, palpitations and leg swelling.  Gastrointestinal: Positive for constipation. Negative for nausea, vomiting and abdominal pain.       Last bm today.  Musculoskeletal: Positive for arthralgias and neck pain. Negative for neck stiffness.  Neurological: Negative for dizziness, tremors, seizures, syncope, facial asymmetry, speech difficulty, weakness, light-headedness, numbness and headaches.  Hematological: Negative for adenopathy. Does not bruise/bleed easily.  Psychiatric/Behavioral: Negative for behavioral problems, confusion and agitation. The patient is not nervous/anxious.     Past Medical History  Diagnosis  Date  . Hypothyroid   . Diverticulitis   . Hypertension   . Hyperlipidemia   . Murmur   . GERD (gastroesophageal reflux disease)   . Endometriosis   . Pre-diabetes   . Macular degeneration   . High cholesterol   . Thyroid activity decreased   . Hearing loss   . Spinal stenosis     Dr. Luiz Blare  . Arthritis     History   Social History  . Marital Status: Widowed    Spouse Name: N/A  . Number of Children: 3  . Years of Education: N/A   Occupational History  . Not on file.   Social History Main Topics  . Smoking status: Never Smoker   . Smokeless tobacco: Never Used  . Alcohol Use: No  . Drug Use: No  . Sexual Activity: Not on file   Other Topics Concern  . Not on file   Social History Narrative   Diet: Fruit , Vegtables., Meat       Do you drink/ eat things with caffeine? 1 cup of coffee,tea      Marital status: Widow                               What year were you married ?1945      Do you live in a house, apartment,assistred living, condo, trailer, etc.)? Apartment Senior Living  (independent)      Is it one or more stories? 3       How many persons  live in your home ? 1      Do you have any pets in your home ?(please list)  Yes (small dog)      Current or past profession: Diplomatic Services operational officer, Receptionist       Do you exercise?  Not much                            Type & how often: Walk with apt. Manger a little      Do you have a living will?  Yes      Do you have a DNR form?    Yes                    If not, do you want to discuss one?       Do you have signed POA?HPOA forms? Yes                If so, please bring to your        appointment          Past Surgical History  Procedure Laterality Date  . Abdominal hysterectomy    . Appendectomy    . Back surgery    . Tonsillectomy    . Ankle surgery    . Knee surgery    . Tumor removal      Family History  Problem Relation Age of Onset  . Heart disease Sister     CHF in her 79s  . Heart disease  Mother   . Diabetes Father   . Heart disease Father   . Gout Son   . Panic disorder Daughter   . Arthritis Mother   . Osteoporosis Sister     Allergies  Allergen Reactions  . Celexa [Citalopram Hydrobromide] Nausea And Vomiting  . Levaquin [Levofloxacin In D5w] Other (See Comments)    Hearing loss  . Red Yeast Rice Extract [Cholestin] Nausea Only  . Zocor [Simvastatin] Other (See Comments)    Elevated LFT, muscles and bones felt sore  . Ciprofloxacin Anxiety    Felt like she was going to lose her mind or pass out  . Miralax [Polyethylene Glycol] Rash    Current Outpatient Prescriptions on File Prior to Visit  Medication Sig Dispense Refill  . ALPRAZolam (XANAX) 1 MG tablet Take 1 to 2 tablet 2 or 3 x daily as needed for anxiety or sleep. (Patient taking differently: Take 1-2 mg by mouth 3 (three) times daily as needed for anxiety or sleep. ) 90 tablet 5  . Bevacizumab (AVASTIN IV) Inject 1 each into the vein every 30 (thirty) days.    Marland Kitchen HYDROcodone-acetaminophen (NORCO/VICODIN) 5-325 MG per tablet Take 1 tablet by mouth every 6 (six) hours as needed. 60 tablet 0  . levothyroxine (SYNTHROID, LEVOTHROID) 75 MCG tablet Take 37.5-75 mcg by mouth daily before breakfast. 1 by mouth daily EXCEPT 1/2 by mouth on Tuesday , 30 minutes before breakfast 90 tablet 99  . magnesium hydroxide (MILK OF MAGNESIA) 400 MG/5ML suspension Take 5 mLs by mouth daily as needed for mild constipation.    . Multiple Vitamin (MULTIVITAMIN) tablet Take 1 tablet by mouth daily.    . Cholecalciferol (VITAMIN D-3) 5000 UNITS TABS Take 5,000 Units by mouth daily.      No current facility-administered medications on file prior to visit.    BP 128/103 mmHg  Pulse 84  Temp(Src) 98.4 F (36.9 C) (Oral)  Ht  (  1.549 m)  Wt 110 lb (49.896 kg)  BMI 20.80 kg/m2  SpO2 98%       Objective:   Physical Exam  General- No acute distress. Pleasant patient. Neck- Full range of motion, no jvd. Mild cspine pain  on palpation Lungs- Clear, even and unlabored. Heart- regular rate and rhythm. Neurologic- CNII- XII grossly intact.  Abdomen Inspection:-Inspection Normal.  Palpation/Perucssion: Palpation and Percussion of the abdomen reveal- Non Tender, No Rebound tenderness, No rigidity(Guarding) and No Palpable abdominal masses.  Liver:-Normal.  Spleen:- Normal.        Assessment & Plan:

## 2015-03-04 NOTE — Assessment & Plan Note (Signed)
On and off constipation. Pt thinks she may need colonosocpy. Will refer her to GI at her request.  Pt will continue milk of magnesia for costipatoin.

## 2015-03-04 NOTE — Assessment & Plan Note (Signed)
Pt is currently on hydrocodone 1 tablet a day. I recommended using 1/2 tab a day due to side effect of constipation.

## 2015-03-04 NOTE — Assessment & Plan Note (Signed)
Will refer to Dr. Regino Schultze. I think he is neruosurgeon.

## 2015-03-04 NOTE — Assessment & Plan Note (Addendum)
Continue levothyroxine. But tsh was very high. Will put tsh in today.

## 2015-03-04 NOTE — Assessment & Plan Note (Signed)
Pt tells me her bp is usually well controlled. Her systolic good today and diastolic mild high. But she reports usually diastolic much better. She is not on bp meds. Will follow and give meds if necessary.

## 2015-03-04 NOTE — Progress Notes (Signed)
Pre visit review using our clinic review tool, if applicable. No additional management support is needed unless otherwise documented below in the visit note. 

## 2015-03-04 NOTE — Patient Instructions (Signed)
Hypertension Pt tells me her bp is usually well controlled. Her systolic good today and diastolic mild high. But she reports usually diastolic much better. She is not on bp meds. Will follow and give meds if necessary.   Hypothyroidism Continue levothyroxine. But tsh was very high. Will put tsh in today.  Spinal stenosis of cervical region Will refer to Dr. Regino Schultze. I think he is neruosurgeon.  DJD Pt is currently on hydrocodone 1 tablet a day. I recommended using 1/2 tab a day due to side effect of constipation.  Insomnia Pt takes xanax one tablet at night has been on this for years. She is new to our office. Not ideal but has been on.  Constipation On and off constipation. Pt thinks she may need colonosocpy. Will refer her to GI at her request.  Pt will continue milk of magnesia for costipatoin.    Follow up in 3 wks or as needed

## 2015-03-05 ENCOUNTER — Encounter: Payer: Self-pay | Admitting: Gastroenterology

## 2015-03-05 ENCOUNTER — Ambulatory Visit: Payer: Medicare Other | Admitting: Internal Medicine

## 2015-03-05 LAB — TSH: TSH: 1 u[IU]/mL (ref 0.35–4.50)

## 2015-03-29 DIAGNOSIS — M47812 Spondylosis without myelopathy or radiculopathy, cervical region: Secondary | ICD-10-CM | POA: Diagnosis not present

## 2015-03-30 DIAGNOSIS — H3532 Exudative age-related macular degeneration: Secondary | ICD-10-CM | POA: Diagnosis not present

## 2015-04-14 ENCOUNTER — Encounter: Payer: Self-pay | Admitting: Physician Assistant

## 2015-04-14 ENCOUNTER — Ambulatory Visit: Payer: Medicare Other | Admitting: Physician Assistant

## 2015-04-14 ENCOUNTER — Encounter (INDEPENDENT_AMBULATORY_CARE_PROVIDER_SITE_OTHER): Payer: Medicare Other | Admitting: Physician Assistant

## 2015-04-20 DIAGNOSIS — M47812 Spondylosis without myelopathy or radiculopathy, cervical region: Secondary | ICD-10-CM | POA: Diagnosis not present

## 2015-04-28 ENCOUNTER — Telehealth: Payer: Self-pay | Admitting: Medical

## 2015-04-28 NOTE — Telephone Encounter (Signed)
pre visit letter mailed 04/21/15  °

## 2015-05-11 ENCOUNTER — Ambulatory Visit: Payer: Medicare Other | Admitting: Gastroenterology

## 2015-05-12 ENCOUNTER — Encounter: Payer: Self-pay | Admitting: Medical

## 2015-05-12 ENCOUNTER — Ambulatory Visit (INDEPENDENT_AMBULATORY_CARE_PROVIDER_SITE_OTHER): Payer: Medicare Other | Admitting: Medical

## 2015-05-12 VITALS — BP 118/70 | HR 82 | Temp 98.0°F | Resp 16 | Ht 61.0 in | Wt 114.0 lb

## 2015-05-12 DIAGNOSIS — H9190 Unspecified hearing loss, unspecified ear: Secondary | ICD-10-CM

## 2015-05-12 DIAGNOSIS — K5909 Other constipation: Secondary | ICD-10-CM

## 2015-05-12 DIAGNOSIS — Z0189 Encounter for other specified special examinations: Secondary | ICD-10-CM

## 2015-05-12 DIAGNOSIS — Z23 Encounter for immunization: Secondary | ICD-10-CM

## 2015-05-12 DIAGNOSIS — Z0000 Encounter for general adult medical examination without abnormal findings: Secondary | ICD-10-CM | POA: Insufficient documentation

## 2015-05-12 DIAGNOSIS — Z1211 Encounter for screening for malignant neoplasm of colon: Secondary | ICD-10-CM

## 2015-05-12 LAB — POCT URINALYSIS DIPSTICK
Bilirubin, UA: NEGATIVE
Blood, UA: NEGATIVE
Glucose, UA: NEGATIVE
Ketones, UA: NEGATIVE
LEUKOCYTES UA: NEGATIVE
Nitrite, UA: NEGATIVE
Protein, UA: NEGATIVE
Spec Grav, UA: 1.025
UROBILINOGEN UA: 0.2
pH, UA: 5

## 2015-05-12 MED ORDER — PNEUMOCOCCAL 13-VAL CONJ VACC IM SUSP: 0.5000 mL | INTRAMUSCULAR | Status: DC

## 2015-05-12 MED ORDER — PNEUMOCOCCAL 13-VAL CONJ VACC IM SUSP
0.5000 mL | Freq: Once | INTRAMUSCULAR | Status: AC
  Administered 2015-05-12: 0.5 mL via INTRAMUSCULAR

## 2015-05-12 NOTE — Progress Notes (Signed)
Subjective:    Patient ID: Kristie [name], female    DOB: [birth year], 81 y.o.   MRN: [id number]  HPI  Pt in for physical exam.   BMI done today. Bp good today  and screening depression. Pt not depressed. Screening  hearing screening.(Did not hear well on screening) I did have to repeat myself a lot during interview. She does not smoke.  Pt has living will and DNR form on shart review/last note.  Pt performs serial 7, and spells the word world backwards. Recognize pen and paper. Draws face of a clock. Can tell time. Can follow directions. Recall of words does well.  Pt can dress, climb stairs, sit on toilet by self. Performs adl well.   Pt is 73 aged out of colonsocpy techinically. No major GI type symptoms except can get constipated easier. Negative stool cards for blood or cancer screening done by her inusrance. Unclear what test they did. Pt want referral for coloncsocpy based on prior pcp office recommendation. She wants to see different office than prior office.  Completed zostavax. Completed dexascan. No worrisome lesion on her breast reported.  Her specialist are- Pt states no recent specialist since left prior practice only has seen Orthopedist- Dr. Luiz Blare and Dr. Deberah Castle.  Pneumovaccine- given today.  See ROS regarding spinal stenosis.  Also at the end noticed occasional transient chest pain faint discomfort. Faint discomfort and weakness usually at night when lies down. States has every day. None presently. Pt declined cardiology referral. Pt declines ekg now. She declined stating she knows she is 68 and would not let any one do any procedure on her.      Review of Systems  Constitutional: Negative for fever, chills and fatigue.  Respiratory: Negative for cough, chest tightness, shortness of [name] and wheezing.   Cardiovascular: Negative for chest pain and palpitations.  Gastrointestinal: Positive for constipation. Negative for nausea, vomiting, abdominal pain,  diarrhea, blood in stool, abdominal distention, anal bleeding and rectal pain.       Hx of but normal no constipation presently.  But describes tolerate very rare tramadol with not a lot of side effects.  Musculoskeletal: Positive for back pain.       Hx of spinal stenosis. Her orthopedist stated she can't get surgery. She states her orthopedist won't do surgery due to risk.   Pt does not want to take narcotics. Get constipated easily. Had to be manualy disimpacted August of last year.    Past Medical History  Diagnosis Date  . Hypothyroid   . Diverticulitis   . Hypertension   . Hyperlipidemia   . Murmur   . GERD (gastroesophageal reflux disease)   . Endometriosis   . Pre-diabetes   . Macular degeneration   . High cholesterol   . Thyroid activity decreased   . Hearing loss   . Spinal stenosis     Dr. Luiz Blare  . Arthritis     Social History   Social History  . Marital Status: Widowed    Spouse Name: N/A  . Number of Children: 3  . Years of Education: N/A   Occupational History  . Not on file.   Social History Main Topics  . Smoking status: Never Smoker   . Smokeless tobacco: Never Used  . Alcohol Use: No  . Drug Use: No  . Sexual Activity: Not on file   Other Topics Concern  . Not on file   Social History Narrative   Diet: Fruit , Vegtables., Meat  Do you drink/ eat things with caffeine? 1 cup of coffee,tea      Marital status: Widow                               What year were you married ?1945      Do you live in a house, apartment,assistred living, condo, trailer, etc.)? Apartment Senior Living  (independent)      Is it one or more stories? 3       How many persons live in your home ? 1      Do you have any pets in your home ?(please list)  Yes (small dog)      Current or past profession: Diplomatic Services operational officer, Receptionist       Do you exercise?  Not much                            Type & how often: Walk with apt. Manger a little      Do you have a  living will?  Yes      Do you have a DNR form?    Yes                    If not, do you want to discuss one?       Do you have signed POA?HPOA forms? Yes                If so, please bring to your        appointment          Past Surgical History  Procedure Laterality Date  . Abdominal hysterectomy    . Appendectomy    . Back surgery    . Tonsillectomy    . Ankle surgery    . Knee surgery    . Tumor removal      Family History  Problem Relation Age of Onset  . Heart disease Sister     CHF in her 57s  . Heart disease Mother   . Diabetes Father   . Heart disease Father   . Gout Son   . Panic disorder Daughter   . Arthritis Mother   . Osteoporosis Sister     Allergies  Allergen Reactions  . Celexa [Citalopram Hydrobromide] Nausea And Vomiting  . Levaquin [Levofloxacin In D5w] Other (See Comments)    Hearing loss  . Red Yeast Rice Extract [Cholestin] Nausea Only  . Zocor [Simvastatin] Other (See Comments)    Elevated LFT, muscles and bones felt sore  . Ciprofloxacin Anxiety    Felt like she was going to lose her mind or pass out  . Miralax [Polyethylene Glycol] Rash    Current Outpatient Prescriptions on File Prior to Visit  Medication Sig Dispense Refill  . ALPRAZolam (XANAX) 1 MG tablet Take 1 to 2 tablet 2 or 3 x daily as needed for anxiety or sleep. (Patient taking differently: Take 1-2 mg by mouth 3 (three) times daily as needed for anxiety or sleep. ) 90 tablet 5  . Bevacizumab (AVASTIN IV) Inject 1 each into the vein every 30 (thirty) days. Inject one drop into eyeball.    . Cholecalciferol (VITAMIN D-3) 5000 UNITS TABS Take 5,000 Units by mouth daily.     Marland Kitchen levothyroxine (SYNTHROID, LEVOTHROID) 75 MCG tablet Take 37.5-75 mcg by mouth daily before breakfast. 1 by mouth daily EXCEPT 1/2  by mouth on Tuesday , 30 minutes before breakfast 90 tablet 99  . magnesium hydroxide (MILK OF MAGNESIA) 400 MG/5ML suspension Take 5 mLs by mouth daily as needed for mild  constipation.    . Multiple Vitamin (MULTIVITAMIN) tablet Take 1 tablet by mouth daily.    . traMADol (ULTRAM) 50 MG tablet Take by mouth every 6 (six) hours as needed.     No current facility-administered medications on file prior to visit.    BP 118/70 mmHg  Pulse 82  Temp(Src) 98 F (36.7 C) (Oral)  Resp 16  Ht 5\' 1"  (1.549 m)  Wt 114 lb (51.71 kg)  BMI 21.55 kg/m2  SpO2 97%       Objective:   Physical Exam  General   Mental Status- Alert.  Orientation-Oriented x3. Build and Nutrition Well Nourished and Well Developed.  Skin General: Normal.  Color- Normal color. Moisture- Dry.Temperature warm. Lesions: No suspicious lesions  Head, Eyes, Ears, Nose, Thoat Ears-Normal. Auditory Canal-Bilateral-Normal. Tympanic Membrane- Bilateral-Normal. Eyes Fundi- Bilateral-Normal. Pupil- Bilateral- Direct reaction to light normal. Nose & Sinuses- Normal. Nostril- Bilateral-Normal.  Neck Neck- No Bruits or Masses. Thyroid- Normal. No thyromegaly or nodules.    Chest and Lung Exam  Percussion: Quality and Intensity:-Percussion normal. Percussion of chest reveals- No Dullness. Palpation of the chest reveals- Non-tender. Auscultation: [name] sounds-Normal. Adventitious  Sounds:No adventitious   .  Abdomen Inspection:- Inspection Normal. Inspection of abdomen reveals- No Hernias. Palpation/Percussion: Palpation and Percussion of the abdomen reveal- Non Tender and No Palpable masses. Liver: Other Characteristics- No Hepatmegaly Spleen:Other Characteristics- No Splenomegaly. Auscultation: Auscultation of the abdomen reveals-Bowel sounds normal and No Abdominal bruits.    Neurologic Mental Status- Normal Cranial Nerves- Normal Bilaterally, Motor- Normal. Strength:5/5 normal muscle strength- All Muscles.  Coordination- Normal. Gait- Normal. Meningeal Signs- None.  Musculoskeletal Global Assessment General- Joints show full range of motion without obvious deformity  and Normal muscle mass. Strength 5/5 in upper and lower extremities.  Lymphatic General lymphatics Description-No Generalized lymphadenopathy.  Lt forearm- small scab present.          Assessment & Plan:  Since established pt with medicare for years did [id number].

## 2015-05-12 NOTE — Addendum Note (Signed)
Addended by: Neldon Labella on: 05/12/2015 05:03 PM   Modules accepted: Orders

## 2015-05-12 NOTE — Assessment & Plan Note (Addendum)
psv-13 today. Wellness labs deferred today pt. Ekg deferred today by pt. Will refer to Gi for possible colonoscopy at pt request.

## 2015-05-12 NOTE — Patient Instructions (Addendum)
Wellness examination psv-13 today. Wellness labs deferred today pt. Ekg deferred today by pt. Will refer to Gi for possible colonoscopy at pt request.      For your constipation use occasional milk of magnesia. If no bm for more than 2 days notify us.  For spinal stenosis pain. Limit tramadol to 1 tab a day. Can use low dose ibuprofen 2-3 times a week at most as other option for pain control.  Make sure the small lesion rt forearm which you state came from trauma heals within 2 wks. If not then will refer to dermatologist.  You declined ekg today and referral to cardiologsit. If you change your mind let us know.   Preventive Care for Adults A healthy lifestyle and preventive care can promote health and wellness. Preventive health guidelines for women include the following key practices.  A routine yearly physical is a good way to check with your health care provider about your health and preventive screening. It is a chance to share any concerns and updates on your health and to receive a thorough exam.  Visit your dentist for a routine exam and preventive care every 6 months. Brush your teeth twice a day and floss once a day. Good oral hygiene prevents tooth decay and gum disease.  The frequency of eye exams is based on your age, health, family medical history, use of contact lenses, and other factors. Follow your health care provider's recommendations for frequency of eye exams.  Eat a healthy diet. Foods like vegetables, fruits, whole grains, low-fat dairy products, and lean protein foods contain the nutrients you need without too many calories. Decrease your intake of foods high in solid fats, added sugars, and salt. Eat the right amount of calories for you.Get information about a proper diet from your health care provider, if necessary.  Regular physical exercise is one of the most important things you can do for your health. Most adults should get at least 150 minutes of  moderate-intensity exercise (any activity that increases your heart rate and causes you to sweat) each week. In addition, most adults need muscle-strengthening exercises on 2 or more days a week.  Maintain a healthy weight. The body mass index (BMI) is a screening tool to identify possible weight problems. It provides an estimate of body fat based on height and weight. Your health care provider can find your BMI and can help you achieve or maintain a healthy weight.For adults 20 years and older:  A BMI below 18.5 is considered underweight.  A BMI of 18.5 to 24.9 is normal.  A BMI of 25 to 29.9 is considered overweight.  A BMI of 30 and above is considered obese.  Maintain normal blood lipids and cholesterol levels by exercising and minimizing your intake of saturated fat. Eat a balanced diet with plenty of fruit and vegetables. Blood tests for lipids and cholesterol should begin at age 81 and be repeated every 5 years. If your lipid or cholesterol levels are high, you are over 50, or you are at high risk for heart disease, you may need your cholesterol levels checked more frequently.Ongoing high lipid and cholesterol levels should be treated with medicines if diet and exercise are not working.  If you smoke, find out from your health care provider how to quit. If you do not use tobacco, do not start.  Lung cancer screening is recommended for adults aged 22-80 years who are at high risk for developing lung cancer because of a history of  smoking. A yearly low-dose CT scan of the lungs is recommended for people who have at least a 30-pack-year history of smoking and are a current smoker or have quit within the past 15 years. A pack year of smoking is smoking an average of 1 pack of cigarettes a day for 1 year (for example: 1 pack a day for 30 years or 2 packs a day for 15 years). Yearly screening should continue until the smoker has stopped smoking for at least 15 years. Yearly screening should be  stopped for people who develop a health problem that would prevent them from having lung cancer treatment.  If you are pregnant, do not drink alcohol. If you are breastfeeding, be very cautious about drinking alcohol. If you are not pregnant and choose to drink alcohol, do not have more than 1 drink per day. One drink is considered to be 12 ounces (355 mL) of beer, 5 ounces (148 mL) of wine, or 1.5 ounces (44 mL) of liquor.  Avoid use of street drugs. Do not share needles with anyone. Ask for help if you need support or instructions about stopping the use of drugs.  High blood pressure causes heart disease and increases the risk of stroke. Your blood pressure should be checked at least every 1 to 2 years. Ongoing high blood pressure should be treated with medicines if weight loss and exercise do not work.  If you are 13-68 years old, ask your health care provider if you should take aspirin to prevent strokes.  Diabetes screening involves taking a blood sample to check your fasting blood sugar level. This should be done once every 3 years, after age 41, if you are within normal weight and without risk factors for diabetes. Testing should be considered at a younger age or be carried out more frequently if you are overweight and have at least 1 risk factor for diabetes.  Breast cancer screening is essential preventive care for women. You should practice "breast self-awareness." This means understanding the normal appearance and feel of your breasts and may include breast self-examination. Any changes detected, no matter how small, should be reported to a health care provider. Women in their 18s and 30s should have a clinical breast exam (CBE) by a health care provider as part of a regular health exam every 1 to 3 years. After age 39, women should have a CBE every year. Starting at age 21, women should consider having a mammogram (breast X-ray test) every year. Women who have a family history of breast  cancer should talk to their health care provider about genetic screening. Women at a high risk of breast cancer should talk to their health care providers about having an MRI and a mammogram every year.  Breast cancer gene (BRCA)-related cancer risk assessment is recommended for women who have family members with BRCA-related cancers. BRCA-related cancers include breast, ovarian, tubal, and peritoneal cancers. Having family members with these cancers may be associated with an increased risk for harmful changes (mutations) in the breast cancer genes BRCA1 and BRCA2. Results of the assessment will determine the need for genetic counseling and BRCA1 and BRCA2 testing.  Routine pelvic exams to screen for cancer are no longer recommended for nonpregnant women who are considered low risk for cancer of the pelvic organs (ovaries, uterus, and vagina) and who do not have symptoms. Ask your health care provider if a screening pelvic exam is right for you.  If you have had past treatment for cervical cancer  or a condition that could lead to cancer, you need Pap tests and screening for cancer for at least 20 years after your treatment. If Pap tests have been discontinued, your risk factors (such as having a new sexual partner) need to be reassessed to determine if screening should be resumed. Some women have medical problems that increase the chance of getting cervical cancer. In these cases, your health care provider may recommend more frequent screening and Pap tests.  The HPV test is an additional test that may be used for cervical cancer screening. The HPV test looks for the virus that can cause the cell changes on the cervix. The cells collected during the Pap test can be tested for HPV. The HPV test could be used to screen women aged 58 years and older, and should be used in women of any age who have unclear Pap test results. After the age of 92, women should have HPV testing at the same frequency as a Pap  test.  Colorectal cancer can be detected and often prevented. Most routine colorectal cancer screening begins at the age of 41 years and continues through age 27 years. However, your health care provider may recommend screening at an earlier age if you have risk factors for colon cancer. On a yearly basis, your health care provider may provide home test kits to check for hidden blood in the stool. Use of a small camera at the end of a tube, to directly examine the colon (sigmoidoscopy or colonoscopy), can detect the earliest forms of colorectal cancer. Talk to your health care provider about this at age 76, when routine screening begins. Direct exam of the colon should be repeated every 5-10 years through age 89 years, unless early forms of pre-cancerous polyps or small growths are found.  People who are at an increased risk for hepatitis B should be screened for this virus. You are considered at high risk for hepatitis B if:  You were born in a country where hepatitis B occurs often. Talk with your health care provider about which countries are considered high risk.  Your parents were born in a high-risk country and you have not received a shot to protect against hepatitis B (hepatitis B vaccine).  You have HIV or AIDS.  You use needles to inject street drugs.  You live with, or have sex with, someone who has hepatitis B.  You get hemodialysis treatment.  You take certain medicines for conditions like cancer, organ transplantation, and autoimmune conditions.  Hepatitis C blood testing is recommended for all people born from [birth year] through 1965 and any individual with known risks for hepatitis C.  Practice safe sex. Use condoms and avoid high-risk sexual practices to reduce the spread of sexually transmitted infections (STIs). STIs include gonorrhea, chlamydia, syphilis, trichomonas, herpes, HPV, and human immunodeficiency virus (HIV). Herpes, HIV, and HPV are viral illnesses that have no cure.  They can result in disability, cancer, and death.  You should be screened for sexually transmitted illnesses (STIs) including gonorrhea and chlamydia if:  You are sexually active and are younger than 24 years.  You are older than 24 years and your health care provider tells you that you are at risk for this type of infection.  Your sexual activity has changed since you were last screened and you are at an increased risk for chlamydia or gonorrhea. Ask your health care provider if you are at risk.  If you are at risk of being infected with HIV, it  is recommended that you take a prescription medicine daily to prevent HIV infection. This is called preexposure prophylaxis (PrEP). You are considered at risk if:  You are a heterosexual woman, are sexually active, and are at increased risk for HIV infection.  You take drugs by injection.  You are sexually active with a partner who has HIV.  Talk with your health care provider about whether you are at high risk of being infected with HIV. If you choose to begin PrEP, you should first be tested for HIV. You should then be tested every 3 months for as long as you are taking PrEP.  Osteoporosis is a disease in which the bones lose minerals and strength with aging. This can result in serious bone fractures or breaks. The risk of osteoporosis can be identified using a bone density scan. Women ages 71 years and over and women at risk for fractures or osteoporosis should discuss screening with their health care providers. Ask your health care provider whether you should take a calcium supplement or vitamin D to reduce the rate of osteoporosis.  Menopause can be associated with physical symptoms and risks. Hormone replacement therapy is available to decrease symptoms and risks. You should talk to your health care provider about whether hormone replacement therapy is right for you.  Use sunscreen. Apply sunscreen liberally and repeatedly throughout the day.  You should seek shade when your shadow is shorter than you. Protect yourself by wearing long sleeves, pants, a wide-brimmed hat, and sunglasses year round, whenever you are outdoors.  Once a month, do a whole body skin exam, using a mirror to look at the skin on your back. Tell your health care provider of new moles, moles that have irregular borders, moles that are larger than a pencil eraser, or moles that have changed in shape or color.  Stay current with required vaccines (immunizations).  Influenza vaccine. All adults should be immunized every year.  Tetanus, diphtheria, and acellular pertussis (Td, Tdap) vaccine. Pregnant women should receive 1 dose of Tdap vaccine during each pregnancy. The dose should be obtained regardless of the length of time since the last dose. Immunization is preferred during the 27th-36th week of gestation. An adult who has not previously received Tdap or who does not know her vaccine status should receive 1 dose of Tdap. This initial dose should be followed by tetanus and diphtheria toxoids (Td) booster doses every 10 years. Adults with an unknown or incomplete history of completing a 3-dose immunization series with Td-containing vaccines should begin or complete a primary immunization series including a Tdap dose. Adults should receive a Td booster every 10 years.  Varicella vaccine. An adult without evidence of immunity to varicella should receive 2 doses or a second dose if she has previously received 1 dose. Pregnant females who do not have evidence of immunity should receive the first dose after pregnancy. This first dose should be obtained before leaving the health care facility. The second dose should be obtained 4-8 weeks after the first dose.  Human papillomavirus (HPV) vaccine. Females aged 13-26 years who have not received the vaccine previously should obtain the 3-dose series. The vaccine is not recommended for use in pregnant females. However, pregnancy  testing is not needed before receiving a dose. If a female is found to be pregnant after receiving a dose, no treatment is needed. In that case, the remaining doses should be delayed until after the pregnancy. Immunization is recommended for any person with an immunocompromised condition  through the age of 7 years if she did not get any or all doses earlier. During the 3-dose series, the second dose should be obtained 4-8 weeks after the first dose. The third dose should be obtained 24 weeks after the first dose and 16 weeks after the second dose.  Zoster vaccine. One dose is recommended for adults aged 4 years or older unless certain conditions are present.  Measles, mumps, and rubella (MMR) vaccine. Adults born before [birth year] generally are considered immune to measles and mumps. Adults born in [birth year] or later should have 1 or more doses of MMR vaccine unless there is a contraindication to the vaccine or there is laboratory evidence of immunity to each of the three diseases. A routine second dose of MMR vaccine should be obtained at least 28 days after the first dose for students attending postsecondary schools, health care workers, or international travelers. People who received inactivated measles vaccine or an unknown type of measles vaccine during 1963-1967 should receive 2 doses of MMR vaccine. People who received inactivated mumps vaccine or an unknown type of mumps vaccine before 1979 and are at high risk for mumps infection should consider immunization with 2 doses of MMR vaccine. For females of childbearing age, rubella immunity should be determined. If there is no evidence of immunity, females who are not pregnant should be vaccinated. If there is no evidence of immunity, females who are pregnant should delay immunization until after pregnancy. Unvaccinated health care workers born before [birth year] who lack laboratory evidence of measles, mumps, or rubella immunity or laboratory confirmation of disease should  consider measles and mumps immunization with 2 doses of MMR vaccine or rubella immunization with 1 dose of MMR vaccine.  Pneumococcal 13-valent conjugate (PCV13) vaccine. When indicated, a person who is uncertain of her immunization history and has no record of immunization should receive the PCV13 vaccine. An adult aged 88 years or older who has certain medical conditions and has not been previously immunized should receive 1 dose of PCV13 vaccine. This PCV13 should be followed with a dose of pneumococcal polysaccharide (PPSV23) vaccine. The PPSV23 vaccine dose should be obtained at least 8 weeks after the dose of PCV13 vaccine. An adult aged 33 years or older who has certain medical conditions and previously received 1 or more doses of PPSV23 vaccine should receive 1 dose of PCV13. The PCV13 vaccine dose should be obtained 1 or more years after the last PPSV23 vaccine dose.  Pneumococcal polysaccharide (PPSV23) vaccine. When PCV13 is also indicated, PCV13 should be obtained first. All adults aged 18 years and older should be immunized. An adult younger than age 91 years who has certain medical conditions should be immunized. Any person who resides in a nursing home or long-term care facility should be immunized. An adult smoker should be immunized. People with an immunocompromised condition and certain other conditions should receive both PCV13 and PPSV23 vaccines. People with human immunodeficiency virus (HIV) infection should be immunized as soon as possible after diagnosis. Immunization during chemotherapy or radiation therapy should be avoided. Routine use of PPSV23 vaccine is not recommended for American Indians, [place] Natives, or people younger than 65 years unless there are medical conditions that require PPSV23 vaccine. When indicated, people who have unknown immunization and have no record of immunization should receive PPSV23 vaccine. One-time revaccination 5 years after the first dose of PPSV23 is  recommended for people aged 19-64 years who have chronic kidney failure, nephrotic syndrome, asplenia, or immunocompromised conditions. People who  received 1-2 doses of PPSV23 before age 63 years should receive another dose of PPSV23 vaccine at age 60 years or later if at least 5 years have passed since the previous dose. Doses of PPSV23 are not needed for people immunized with PPSV23 at or after age 61 years.  Meningococcal vaccine. Adults with asplenia or persistent complement component deficiencies should receive 2 doses of quadrivalent meningococcal conjugate (MenACWY-D) vaccine. The doses should be obtained at least 2 months apart. Microbiologists working with certain meningococcal bacteria, [place] recruits, people at risk during an outbreak, and people who travel to or live in countries with a high rate of meningitis should be immunized. A first-year college student up through age 80 years who is living in a residence hall should receive a dose if she did not receive a dose on or after her 16th birthday. Adults who have certain high-risk conditions should receive one or more doses of vaccine.  Hepatitis A vaccine. Adults who wish to be protected from this disease, have certain high-risk conditions, work with hepatitis A-infected animals, work in hepatitis A research labs, or travel to or work in countries with a high rate of hepatitis A should be immunized. Adults who were previously unvaccinated and who anticipate close contact with an international adoptee during the first 60 days after arrival in the [place] from a country with a high rate of hepatitis A should be immunized.  Hepatitis B vaccine. Adults who wish to be protected from this disease, have certain high-risk conditions, may be exposed to blood or other infectious body fluids, are household contacts or sex partners of hepatitis B positive people, are clients or workers in certain care facilities, or travel to or work in countries  with a high rate of hepatitis B should be immunized.  Haemophilus influenzae type b (Hib) vaccine. A previously unvaccinated person with asplenia or sickle cell disease or having a scheduled splenectomy should receive 1 dose of Hib vaccine. Regardless of previous immunization, a recipient of a hematopoietic stem cell transplant should receive a 3-dose series 6-12 months after her successful transplant. Hib vaccine is not recommended for adults with HIV infection. Preventive Services / Frequency Ages 28 to 55 years  Blood pressure check.** / Every 1 to 2 years.  Lipid and cholesterol check.** / Every 5 years beginning at age 29.  Clinical breast exam.** / Every 3 years for women in their 9s and 65s.  BRCA-related cancer risk assessment.** / For women who have family members with a BRCA-related cancer (breast, ovarian, tubal, or peritoneal cancers).  Pap test.** / Every 2 years from ages 33 through 11. Every 3 years starting at age 56 through age 46 or 47 with a history of 3 consecutive normal Pap tests.  HPV screening.** / Every 3 years from ages 60 through ages 56 to 59 with a history of 3 consecutive normal Pap tests.  Hepatitis C blood test.** / For any individual with known risks for hepatitis C.  Skin self-exam. / Monthly.  Influenza vaccine. / Every year.  Tetanus, diphtheria, and acellular pertussis (Tdap, Td) vaccine.** / Consult your health care provider. Pregnant women should receive 1 dose of Tdap vaccine during each pregnancy. 1 dose of Td every 10 years.  Varicella vaccine.** / Consult your health care provider. Pregnant females who do not have evidence of immunity should receive the first dose after pregnancy.  HPV vaccine. / 3 doses over 6 months, if 29 and younger. The vaccine is not recommended for use  in pregnant females. However, pregnancy testing is not needed before receiving a dose.  Measles, mumps, rubella (MMR) vaccine.** / You need at least 1 dose of MMR if you  were born in 1957 or later. You may also need a 2nd dose. For females of childbearing age, rubella immunity should be determined. If there is no evidence of immunity, females who are not pregnant should be vaccinated. If there is no evidence of immunity, females who are pregnant should delay immunization until after pregnancy.  Pneumococcal 13-valent conjugate (PCV13) vaccine.** / Consult your health care provider.  Pneumococcal polysaccharide (PPSV23) vaccine.** / 1 to 2 doses if you smoke cigarettes or if you have certain conditions.  Meningococcal vaccine.** / 1 dose if you are age 52 to 80 years and a Market researcher living in a residence hall, or have one of several medical conditions, you need to get vaccinated against meningococcal disease. You may also need additional booster doses.  Hepatitis A vaccine.** / Consult your health care provider.  Hepatitis B vaccine.** / Consult your health care provider.  Haemophilus influenzae type b (Hib) vaccine.** / Consult your health care provider. Ages 42 to 50 years  Blood pressure check.** / Every 1 to 2 years.  Lipid and cholesterol check.** / Every 5 years beginning at age 77 years.  Lung cancer screening. / Every year if you are aged 64-80 years and have a 30-pack-year history of smoking and currently smoke or have quit within the past 15 years. Yearly screening is stopped once you have quit smoking for at least 15 years or develop a health problem that would prevent you from having lung cancer treatment.  Clinical breast exam.** / Every year after age 7 years.  BRCA-related cancer risk assessment.** / For women who have family members with a BRCA-related cancer (breast, ovarian, tubal, or peritoneal cancers).  Mammogram.** / Every year beginning at age 77 years and continuing for as long as you are in good health. Consult with your health care provider.  Pap test.** / Every 3 years starting at age 17 years through age 37 or  71 years with a history of 3 consecutive normal Pap tests.  HPV screening.** / Every 3 years from ages 85 years through ages 56 to 66 years with a history of 3 consecutive normal Pap tests.  Fecal occult blood test (FOBT) of stool. / Every year beginning at age 70 years and continuing until age 70 years. You may not need to do this test if you get a colonoscopy every 10 years.  Flexible sigmoidoscopy or colonoscopy.** / Every 5 years for a flexible sigmoidoscopy or every 10 years for a colonoscopy beginning at age 24 years and continuing until age 2 years.  Hepatitis C blood test.** / For all people born from [birth year] through 1963 and any individual with known risks for hepatitis C.  Skin self-exam. / Monthly.  Influenza vaccine. / Every year.  Tetanus, diphtheria, and acellular pertussis (Tdap/Td) vaccine.** / Consult your health care provider. Pregnant women should receive 1 dose of Tdap vaccine during each pregnancy. 1 dose of Td every 10 years.  Varicella vaccine.** / Consult your health care provider. Pregnant females who do not have evidence of immunity should receive the first dose after pregnancy.  Zoster vaccine.** / 1 dose for adults aged 39 years or older.  Measles, mumps, rubella (MMR) vaccine.** / You need at least 1 dose of MMR if you were born in 1957 or later. You may also need  a 2nd dose. For females of childbearing age, rubella immunity should be determined. If there is no evidence of immunity, females who are not pregnant should be vaccinated. If there is no evidence of immunity, females who are pregnant should delay immunization until after pregnancy.  Pneumococcal 13-valent conjugate (PCV13) vaccine.** / Consult your health care provider.  Pneumococcal polysaccharide (PPSV23) vaccine.** / 1 to 2 doses if you smoke cigarettes or if you have certain conditions.  Meningococcal vaccine.** / Consult your health care provider.  Hepatitis A vaccine.** / Consult your health care  provider.  Hepatitis B vaccine.** / Consult your health care provider.  Haemophilus influenzae type b (Hib) vaccine.** / Consult your health care provider. Ages 42 years and over  Blood pressure check.** / Every 1 to 2 years.  Lipid and cholesterol check.** / Every 5 years beginning at age 46 years.  Lung cancer screening. / Every year if you are aged 87-80 years and have a 30-pack-year history of smoking and currently smoke or have quit within the past 15 years. Yearly screening is stopped once you have quit smoking for at least 15 years or develop a health problem that would prevent you from having lung cancer treatment.  Clinical breast exam.** / Every year after age 71 years.  BRCA-related cancer risk assessment.** / For women who have family members with a BRCA-related cancer (breast, ovarian, tubal, or peritoneal cancers).  Mammogram.** / Every year beginning at age 56 years and continuing for as long as you are in good health. Consult with your health care provider.  Pap test.** / Every 3 years starting at age 31 years through age 66 or 55 years with 3 consecutive normal Pap tests. Testing can be stopped between 65 and 70 years with 3 consecutive normal Pap tests and no abnormal Pap or HPV tests in the past 10 years.  HPV screening.** / Every 3 years from ages 73 years through ages 40 or 77 years with a history of 3 consecutive normal Pap tests. Testing can be stopped between 65 and 70 years with 3 consecutive normal Pap tests and no abnormal Pap or HPV tests in the past 10 years.  Fecal occult blood test (FOBT) of stool. / Every year beginning at age 26 years and continuing until age 7 years. You may not need to do this test if you get a colonoscopy every 10 years.  Flexible sigmoidoscopy or colonoscopy.** / Every 5 years for a flexible sigmoidoscopy or every 10 years for a colonoscopy beginning at age 61 years and continuing until age 42 years.  Hepatitis C blood test.** / For  all people born from [birth year] through 1963 and any individual with known risks for hepatitis C.  Osteoporosis screening.** / A one-time screening for women ages 68 years and over and women at risk for fractures or osteoporosis.  Skin self-exam. / Monthly.  Influenza vaccine. / Every year.  Tetanus, diphtheria, and acellular pertussis (Tdap/Td) vaccine.** / 1 dose of Td every 10 years.  Varicella vaccine.** / Consult your health care provider.  Zoster vaccine.** / 1 dose for adults aged 26 years or older.  Pneumococcal 13-valent conjugate (PCV13) vaccine.** / Consult your health care provider.  Pneumococcal polysaccharide (PPSV23) vaccine.** / 1 dose for all adults aged 36 years and older.  Meningococcal vaccine.** / Consult your health care provider.  Hepatitis A vaccine.** / Consult your health care provider.  Hepatitis B vaccine.** / Consult your health care provider.  Haemophilus influenzae type b (Hib) vaccine.** /  Consult your health care provider. ** Family history and personal history of risk and conditions may change your health care provider's recommendations. Document Released: 10/24/2001 Document Revised: 01/12/2014 Document Reviewed: 01/23/2011 [facility] Patient Information 2015 [place], [place]. This information is not intended to replace advice given to you by your health care provider. Make sure you discuss any questions you have with your health care provider.

## 2015-05-12 NOTE — Progress Notes (Signed)
Pre visit review using our clinic review tool, if applicable. No additional management support is needed unless otherwise documented below in the visit note. 

## 2015-05-13 ENCOUNTER — Encounter: Payer: Self-pay | Admitting: Medical

## 2015-05-13 DIAGNOSIS — H3532 Exudative age-related macular degeneration: Secondary | ICD-10-CM | POA: Diagnosis not present

## 2015-05-27 ENCOUNTER — Telehealth: Payer: Self-pay | Admitting: Medical

## 2015-05-27 DIAGNOSIS — F32A Depression, unspecified: Secondary | ICD-10-CM

## 2015-05-27 DIAGNOSIS — F329 Major depressive disorder, single episode, unspecified: Secondary | ICD-10-CM

## 2015-05-27 DIAGNOSIS — E034 Atrophy of thyroid (acquired): Secondary | ICD-10-CM

## 2015-05-27 NOTE — Telephone Encounter (Signed)
Relation to ZO:XWRU Call back number:[phone] Pharmacy: [facility] - [place], [place] - [address] [phone] (Phone) [phone] (Fax)         Reason for call:  traMADol (ULTRAM) 50 MG tablet  levothyroxine (SYNTHROID, LEVOTHROID) 75 MCG tablet  ALPRAZolam (XANAX) 1 MG tablet

## 2015-05-27 NOTE — Telephone Encounter (Signed)
Pt was seen 05/12/15. Please advise on refills.

## 2015-05-28 ENCOUNTER — Telehealth: Payer: Self-pay | Admitting: Medical

## 2015-05-28 DIAGNOSIS — E034 Atrophy of thyroid (acquired): Secondary | ICD-10-CM

## 2015-05-28 MED ORDER — TRAMADOL HCL 50 MG PO TABS: ORAL_TABLET | ORAL | Status: DC

## 2015-05-28 MED ORDER — ALPRAZOLAM 1 MG PO TABS: 1.0000 mg | ORAL_TABLET | Freq: Every evening | ORAL | Status: AC | PRN

## 2015-05-28 NOTE — Telephone Encounter (Signed)
Spoke with pt and she voice understanding.

## 2015-05-28 NOTE — Telephone Encounter (Signed)
Refilled pt xanax and tramadol. I only gave 30 tabs xanax to use only at night as needed insomnia or anxiety. Tramadol one tab a day for pain. Would you call her pharmacy and ask about the thyroid dose she takes. Instruction are little confusing. I would like to know what is her total mcg dose of thyroid med and just write that for one tab.

## 2015-05-28 NOTE — Telephone Encounter (Signed)
I talked to pharmacist and pt was given 90 tabs in summer. She should still have thyroid tabs left. Would you call and clarify. If she is taking the way is she is supposed per the sig?

## 2015-05-28 NOTE — Telephone Encounter (Signed)
Spoke with Trey Paula at [facility] and He took Rx's over the phone.

## 2015-05-31 NOTE — Telephone Encounter (Signed)
Spoke with Freida Busman at pharmacy and pt still has refills.

## 2015-06-24 DIAGNOSIS — H353231 Exudative age-related macular degeneration, bilateral, with active choroidal neovascularization: Secondary | ICD-10-CM | POA: Diagnosis not present

## 2015-06-28 ENCOUNTER — Telehealth: Payer: Self-pay | Admitting: Medical

## 2015-06-28 DIAGNOSIS — E034 Atrophy of thyroid (acquired): Secondary | ICD-10-CM

## 2015-06-28 MED ORDER — LEVOTHYROXINE SODIUM 75 MCG PO TABS: 37.5000 ug | ORAL_TABLET | Freq: Every day | ORAL | Status: AC

## 2015-06-28 NOTE — Telephone Encounter (Signed)
Relation to ZO:XWRUpt:self Call back number:[phone] Pharmacy: [facility] - [place], [place] - [address]  Reason for call:  Patient requesting a refill levothyroxine (SYNTHROID, LEVOTHROID) 75 MCG tablet and patient states it should say 1 everyday except Tuesday

## 2015-06-28 NOTE — Telephone Encounter (Signed)
Rx sent to pharmacy   

## 2015-06-29 ENCOUNTER — Other Ambulatory Visit: Payer: Self-pay

## 2015-06-29 ENCOUNTER — Telehealth: Payer: Self-pay | Admitting: Medical

## 2015-06-29 NOTE — Addendum Note (Signed)
Addended by: Neldon LabellaMABE, Roena Sassaman S on: 06/29/2015 08:53 AM   Modules accepted: Medications

## 2015-06-29 NOTE — Telephone Encounter (Signed)
Opened to review her rx.

## 2015-06-29 NOTE — Telephone Encounter (Signed)
Would you call pt and see how many times a day she is taking tramadol. I advised just to take one tab a day due to potential side effects and her age. She mentioned hx of constipation. So note in epic states that she is using 4 times a day. Would you see which is the case. Will decide on writing the rx tomorrow after I see how often she is taking.

## 2015-06-29 NOTE — Telephone Encounter (Signed)
Edward please advise on refill.  

## 2015-06-29 NOTE — Telephone Encounter (Signed)
Left message for pt to call back about medication.

## 2015-07-01 ENCOUNTER — Other Ambulatory Visit: Payer: Self-pay

## 2015-07-01 MED ORDER — TRAMADOL HCL 50 MG PO TABS: ORAL_TABLET | ORAL | Status: AC

## 2015-07-01 NOTE — Telephone Encounter (Signed)
Pt is only taking one a day as needed for pain.

## 2015-07-01 NOTE — Telephone Encounter (Signed)
Rx printed and forwarded to provider for signature.   

## 2015-07-03 NOTE — Telephone Encounter (Signed)
Rx for tramadol written after pt clarified she only takes one time a day.

## 2015-08-12 DIAGNOSIS — H353231 Exudative age-related macular degeneration, bilateral, with active choroidal neovascularization: Secondary | ICD-10-CM | POA: Diagnosis not present

## 2015-08-27 DIAGNOSIS — E038 Other specified hypothyroidism: Secondary | ICD-10-CM | POA: Diagnosis not present

## 2015-08-27 DIAGNOSIS — J018 Other acute sinusitis: Secondary | ICD-10-CM | POA: Diagnosis not present

## 2015-08-27 DIAGNOSIS — I10 Essential (primary) hypertension: Secondary | ICD-10-CM | POA: Diagnosis not present

## 2015-08-27 DIAGNOSIS — R0982 Postnasal drip: Secondary | ICD-10-CM | POA: Diagnosis not present

## 2015-08-27 DIAGNOSIS — M1389 Other specified arthritis, multiple sites: Secondary | ICD-10-CM | POA: Diagnosis not present

## 2015-09-29 DIAGNOSIS — M159 Polyosteoarthritis, unspecified: Secondary | ICD-10-CM | POA: Diagnosis not present

## 2015-09-29 DIAGNOSIS — E039 Hypothyroidism, unspecified: Secondary | ICD-10-CM | POA: Diagnosis not present

## 2015-09-29 DIAGNOSIS — E559 Vitamin D deficiency, unspecified: Secondary | ICD-10-CM | POA: Diagnosis not present

## 2015-09-29 DIAGNOSIS — R7303 Prediabetes: Secondary | ICD-10-CM | POA: Diagnosis not present

## 2015-09-29 DIAGNOSIS — Z23 Encounter for immunization: Secondary | ICD-10-CM | POA: Diagnosis not present

## 2015-09-29 DIAGNOSIS — I10 Essential (primary) hypertension: Secondary | ICD-10-CM | POA: Diagnosis not present

## 2015-09-29 DIAGNOSIS — K5909 Other constipation: Secondary | ICD-10-CM | POA: Diagnosis not present

## 2015-09-30 DIAGNOSIS — H353231 Exudative age-related macular degeneration, bilateral, with active choroidal neovascularization: Secondary | ICD-10-CM | POA: Diagnosis not present

## 2015-10-05 DIAGNOSIS — K5909 Other constipation: Secondary | ICD-10-CM | POA: Diagnosis not present

## 2015-11-18 DIAGNOSIS — H53002 Unspecified amblyopia, left eye: Secondary | ICD-10-CM | POA: Diagnosis not present

## 2015-11-18 DIAGNOSIS — H353211 Exudative age-related macular degeneration, right eye, with active choroidal neovascularization: Secondary | ICD-10-CM | POA: Diagnosis not present

## 2015-11-18 DIAGNOSIS — Z961 Presence of intraocular lens: Secondary | ICD-10-CM | POA: Diagnosis not present

## 2015-11-18 DIAGNOSIS — H2512 Age-related nuclear cataract, left eye: Secondary | ICD-10-CM | POA: Diagnosis not present

## 2015-11-24 DIAGNOSIS — M159 Polyosteoarthritis, unspecified: Secondary | ICD-10-CM | POA: Diagnosis not present

## 2015-11-24 DIAGNOSIS — G894 Chronic pain syndrome: Secondary | ICD-10-CM | POA: Diagnosis not present

## 2015-11-24 DIAGNOSIS — K5909 Other constipation: Secondary | ICD-10-CM | POA: Diagnosis not present

## 2015-11-24 DIAGNOSIS — G4700 Insomnia, unspecified: Secondary | ICD-10-CM | POA: Diagnosis not present

## 2015-11-24 DIAGNOSIS — I10 Essential (primary) hypertension: Secondary | ICD-10-CM | POA: Diagnosis not present

## 2015-12-17 NOTE — Progress Notes (Signed)
This encounter was created in error - please disregard.

## 2016-01-06 DIAGNOSIS — H353211 Exudative age-related macular degeneration, right eye, with active choroidal neovascularization: Secondary | ICD-10-CM | POA: Diagnosis not present

## 2016-01-31 DIAGNOSIS — F5101 Primary insomnia: Secondary | ICD-10-CM | POA: Diagnosis not present

## 2016-01-31 DIAGNOSIS — Z748 Other problems related to care provider dependency: Secondary | ICD-10-CM | POA: Diagnosis not present

## 2016-01-31 DIAGNOSIS — I10 Essential (primary) hypertension: Secondary | ICD-10-CM | POA: Diagnosis not present

## 2016-01-31 DIAGNOSIS — Z0000 Encounter for general adult medical examination without abnormal findings: Secondary | ICD-10-CM | POA: Diagnosis not present

## 2016-01-31 DIAGNOSIS — E034 Atrophy of thyroid (acquired): Secondary | ICD-10-CM | POA: Diagnosis not present

## 2016-02-24 DIAGNOSIS — H353211 Exudative age-related macular degeneration, right eye, with active choroidal neovascularization: Secondary | ICD-10-CM | POA: Diagnosis not present

## 2016-03-16 IMAGING — CR DG KNEE COMPLETE 4+V*R*
4 series · 4 of 4 positions shown · non-contrast
Comparison: None.

CLINICAL DATA: Two day history of pain.  No recent trauma

EXAM:
RIGHT KNEE - COMPLETE 4+ VIEW

[x knee ap right (1 of 4)]
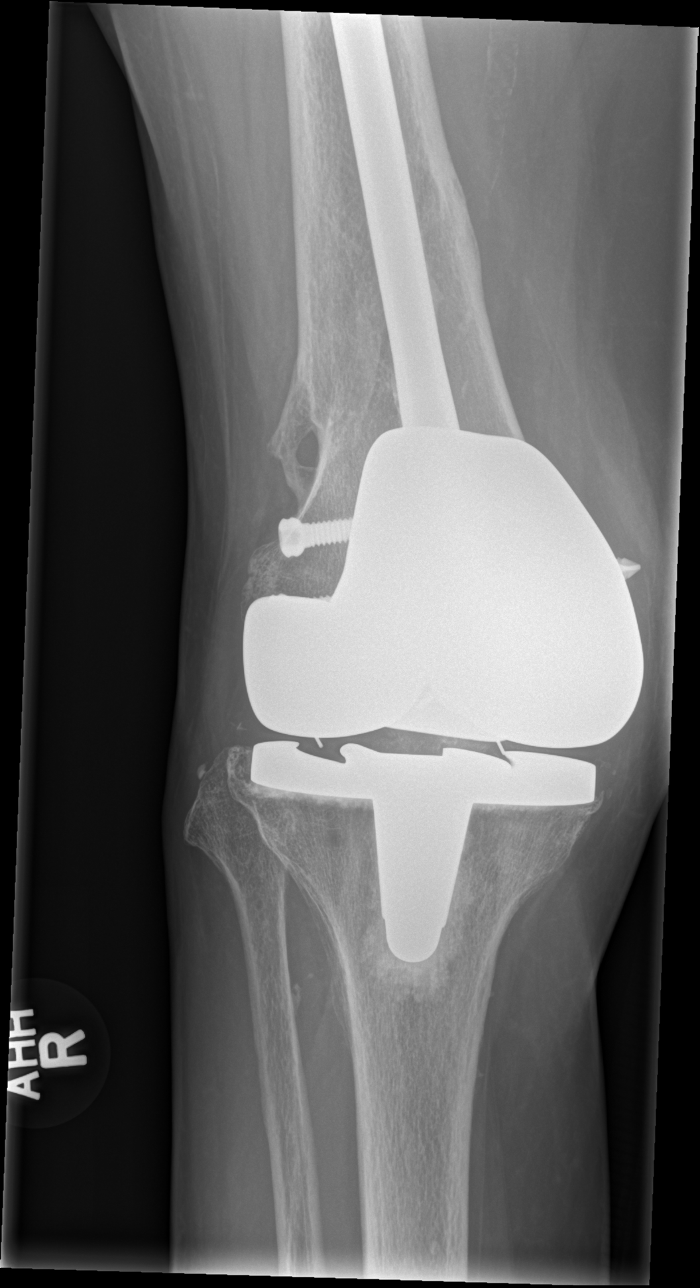

[x knee ap right (2 of 4)]
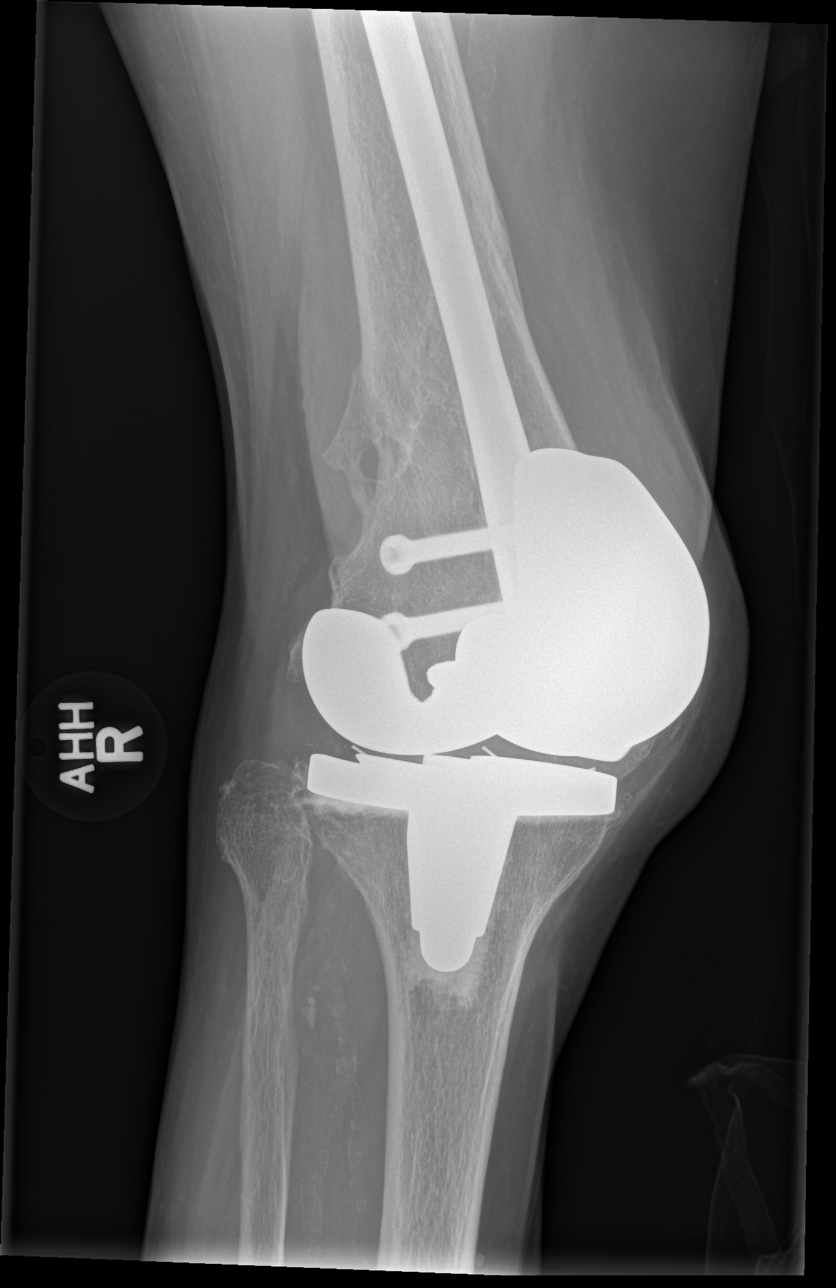

[x knee ap right (3 of 4)]
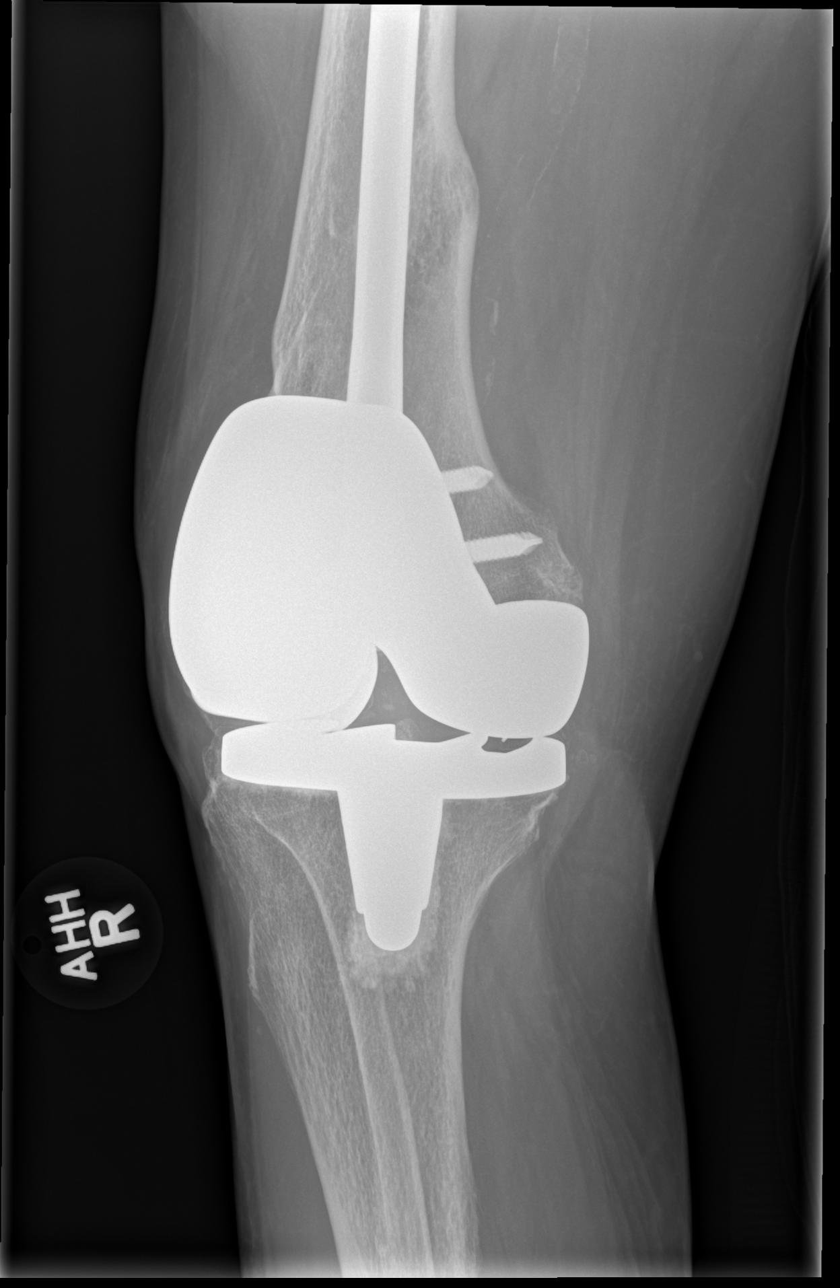

[x knee ap right (4 of 4)]
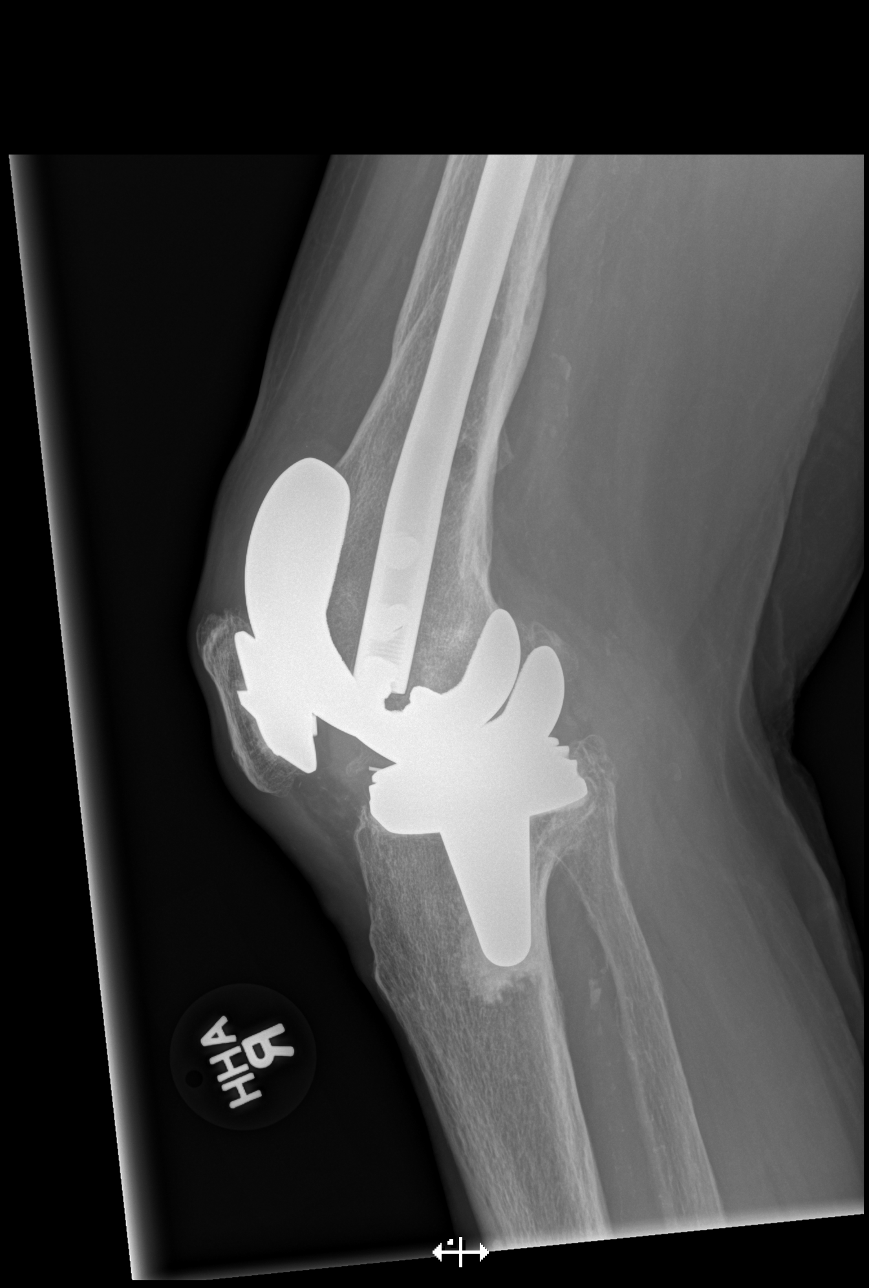

[4 of 4 positions shown; findings below may reference images not displayed]

FINDINGS: Frontal, lateral, and bilateral oblique views were obtained. The
patient is status post total knee replacement with prosthetic
components appearing well-seated. There is rod fixation through the
mid the distal femur with remodeling from old fracture. There is no
acute fracture or dislocation. No erosive change or bony
destruction.
IMPRESSION: Prosthetic components appear well seated. Bony remodeling distal
femur without acute fracture or dislocation. No erosive change or
bony destruction. No appreciable knee joint effusion.

## 2016-04-20 DIAGNOSIS — H353211 Exudative age-related macular degeneration, right eye, with active choroidal neovascularization: Secondary | ICD-10-CM | POA: Diagnosis not present

## 2016-04-26 DIAGNOSIS — R5381 Other malaise: Secondary | ICD-10-CM | POA: Diagnosis not present

## 2016-04-26 DIAGNOSIS — R5383 Other fatigue: Secondary | ICD-10-CM | POA: Diagnosis not present

## 2016-04-26 DIAGNOSIS — K5909 Other constipation: Secondary | ICD-10-CM | POA: Diagnosis not present

## 2016-04-26 DIAGNOSIS — I10 Essential (primary) hypertension: Secondary | ICD-10-CM | POA: Diagnosis not present

## 2016-04-26 DIAGNOSIS — E039 Hypothyroidism, unspecified: Secondary | ICD-10-CM | POA: Diagnosis not present

## 2016-04-26 DIAGNOSIS — R110 Nausea: Secondary | ICD-10-CM | POA: Diagnosis not present

## 2021-02-09 DEATH — deceased
